# Patient Record
Sex: Female | Born: 1985 | Race: Black or African American | Hispanic: No | Marital: Single | Smoking: Never smoker
Health system: Southern US, Community
[De-identification: ages and names within clinical notes are randomized; demographics above are authoritative.]

## PROBLEM LIST (undated history)

## (undated) DIAGNOSIS — D649 Anemia, unspecified: Secondary | ICD-10-CM

## (undated) DIAGNOSIS — O468X9 Other antepartum hemorrhage, unspecified trimester: Secondary | ICD-10-CM

## (undated) DIAGNOSIS — O418X90 Other specified disorders of amniotic fluid and membranes, unspecified trimester, not applicable or unspecified: Secondary | ICD-10-CM

## (undated) DIAGNOSIS — O09299 Supervision of pregnancy with other poor reproductive or obstetric history, unspecified trimester: Secondary | ICD-10-CM

---

## 2009-01-16 ENCOUNTER — Emergency Department (HOSPITAL_COMMUNITY): Admission: EM | Admit: 2009-01-16 | Discharge: 2009-01-16 | Payer: Self-pay | Admitting: Emergency Medicine

## 2009-03-09 ENCOUNTER — Emergency Department (HOSPITAL_BASED_OUTPATIENT_CLINIC_OR_DEPARTMENT_OTHER): Admission: EM | Admit: 2009-03-09 | Discharge: 2009-03-09 | Payer: Self-pay | Admitting: Emergency Medicine

## 2009-06-22 ENCOUNTER — Emergency Department (HOSPITAL_BASED_OUTPATIENT_CLINIC_OR_DEPARTMENT_OTHER): Admission: EM | Admit: 2009-06-22 | Discharge: 2009-06-22 | Payer: Self-pay | Admitting: Emergency Medicine

## 2009-06-22 ENCOUNTER — Ambulatory Visit: Payer: Self-pay | Admitting: Diagnostic Radiology

## 2009-10-04 ENCOUNTER — Emergency Department (HOSPITAL_BASED_OUTPATIENT_CLINIC_OR_DEPARTMENT_OTHER): Admission: EM | Admit: 2009-10-04 | Discharge: 2009-10-04 | Payer: Self-pay | Admitting: Emergency Medicine

## 2010-05-25 ENCOUNTER — Emergency Department (HOSPITAL_BASED_OUTPATIENT_CLINIC_OR_DEPARTMENT_OTHER)
Admission: EM | Admit: 2010-05-25 | Discharge: 2010-05-26 | Disposition: A | Discharge status: Home / Self Care | Payer: Medicaid Other | Attending: Emergency Medicine | Admitting: Emergency Medicine

## 2010-05-25 DIAGNOSIS — R109 Unspecified abdominal pain: Secondary | ICD-10-CM | POA: Insufficient documentation

## 2010-05-25 LAB — URINALYSIS, ROUTINE W REFLEX MICROSCOPIC
Hgb urine dipstick: NEGATIVE
Ketones, ur: NEGATIVE mg/dL
Nitrite: NEGATIVE
Protein, ur: NEGATIVE mg/dL
Specific Gravity, Urine: 1.03 (ref 1.005–1.030)
Urine Glucose, Fasting: NEGATIVE mg/dL
pH: 7 (ref 5.0–8.0)

## 2010-05-25 LAB — COMPREHENSIVE METABOLIC PANEL
BUN: 12 mg/dL (ref 6–23)
Calcium: 9.8 mg/dL (ref 8.4–10.5)
Chloride: 109 mEq/L (ref 96–112)
GFR calc Af Amer: >60 mL/min (ref >60)
GFR calc non Af Amer: >60 mL/min (ref >60)

## 2010-05-25 LAB — CBC
MCHC: 33 g/dL (ref 30.0–36.0)
RBC: 4.67 MIL/uL (ref 3.87–5.11)
RDW: 15.2 % (ref 11.5–15.5)

## 2010-09-03 ENCOUNTER — Emergency Department (HOSPITAL_BASED_OUTPATIENT_CLINIC_OR_DEPARTMENT_OTHER)
Admission: EM | Admit: 2010-09-03 | Discharge: 2010-09-03 | Disposition: A | Discharge status: Home / Self Care | Payer: Self-pay | Attending: Emergency Medicine | Admitting: Emergency Medicine

## 2010-09-03 DIAGNOSIS — H109 Unspecified conjunctivitis: Secondary | ICD-10-CM | POA: Insufficient documentation

## 2010-09-03 DIAGNOSIS — J45909 Unspecified asthma, uncomplicated: Secondary | ICD-10-CM | POA: Insufficient documentation

## 2010-09-03 DIAGNOSIS — R059 Cough, unspecified: Secondary | ICD-10-CM | POA: Insufficient documentation

## 2010-09-03 DIAGNOSIS — R05 Cough: Secondary | ICD-10-CM | POA: Insufficient documentation

## 2010-09-03 DIAGNOSIS — H5789 Other specified disorders of eye and adnexa: Secondary | ICD-10-CM | POA: Insufficient documentation

## 2011-03-31 ENCOUNTER — Emergency Department (HOSPITAL_BASED_OUTPATIENT_CLINIC_OR_DEPARTMENT_OTHER)
Admission: EM | Admit: 2011-03-31 | Discharge: 2011-03-31 | Disposition: A | Discharge status: Home / Self Care | Payer: Self-pay | Attending: Emergency Medicine | Admitting: Emergency Medicine

## 2011-03-31 ENCOUNTER — Encounter: Payer: Self-pay | Admitting: Emergency Medicine

## 2011-03-31 ENCOUNTER — Emergency Department (INDEPENDENT_AMBULATORY_CARE_PROVIDER_SITE_OTHER): Payer: Self-pay

## 2011-03-31 DIAGNOSIS — J45909 Unspecified asthma, uncomplicated: Secondary | ICD-10-CM | POA: Insufficient documentation

## 2011-03-31 DIAGNOSIS — R0602 Shortness of breath: Secondary | ICD-10-CM

## 2011-03-31 DIAGNOSIS — R079 Chest pain, unspecified: Secondary | ICD-10-CM

## 2011-03-31 DIAGNOSIS — M549 Dorsalgia, unspecified: Secondary | ICD-10-CM

## 2011-03-31 DIAGNOSIS — IMO0002 Reserved for concepts with insufficient information to code with codable children: Secondary | ICD-10-CM | POA: Insufficient documentation

## 2011-03-31 DIAGNOSIS — R209 Unspecified disturbances of skin sensation: Secondary | ICD-10-CM | POA: Insufficient documentation

## 2011-03-31 DIAGNOSIS — R091 Pleurisy: Secondary | ICD-10-CM | POA: Insufficient documentation

## 2011-03-31 HISTORY — DX: Anemia, unspecified: D64.9

## 2011-03-31 MED ORDER — NAPROXEN 500 MG PO TABS: 500.0000 mg | ORAL_TABLET | Freq: Two times a day (BID) | ORAL | Status: DC

## 2011-03-31 MED ORDER — DOXYCYCLINE HYCLATE 100 MG PO CAPS: 100.0000 mg | ORAL_CAPSULE | Freq: Two times a day (BID) | ORAL | Status: AC

## 2011-03-31 NOTE — ED Notes (Signed)
Pt states she has been having chest pain for one month, intermittent stabbing pain radiates through back.  Occasional SOB.  Also co numbness/tingling in legs.  Pt states she has noticed that she falls asleep behind the wheel while driving from work.  Denies N/V or diaphoresis.  Did admit to dizziness on one occasion.

## 2011-03-31 NOTE — ED Provider Notes (Signed)
History     CSN: [id number]  Arrival date & time 03/31/11  1839   First MD Initiated Contact with Patient 03/31/11 1901      Chief Complaint  Patient presents with  . Chest Pain  . Shortness of Breath  . Numbness    `    (Consider location/radiation/quality/duration/timing/severity/associated sxs/prior treatment) HPI Comments: Patient is a healthy 26 year old female with history of anemia who presents with 3 complaints.  #1 chest pain. The patient states that she has intermittent chest pain for the last month which comes on the middle of her chest radiating to her right breast and back. It is described as a stabbing pain, lasts approximately one to 2 minutes, pain is exacerbated by deep breathing when the pain comes on. She denies cough fever swelling nausea shortness of breath headache sore throat or nasal congestion. She states that the pain only happens when she is at work, does not happen when she works out and she has been exercising regularly over the last month. She denies travel, swelling, trauma, surgery.  #2 tingling in the feet.  Patient states that over the last several months she has had tingling in her bilateral feet that comes on when she raises her feet at night on pillow. It feels like pins and needles and goes away when she puts her feet down and starts to walk. She denies any symptoms at this time  #3 right index finger swelling.  She admits to having approximately 3-4 days of swelling of the right index finger over the distal radial surface of the finger in the paronychial area.  This started 3-4 days ago, occurred after she had her nails done at the nail salon. It is mild and is spontaneously draining purulent material onto the nail. She denies fevers nausea vomiting. She is not a diabetic  Patient is a 26 y.o. female presenting with chest pain and shortness of breath. The history is provided by the patient.  Chest Pain Pertinent negatives for primary symptoms include  no shortness of breath.    Shortness of Breath  Associated symptoms include chest pain. Pertinent negatives include no shortness of breath.    Past Medical History  Diagnosis Date  . Asthma   . Anemia     Past Surgical History  Procedure Date  . Cesarean section     History reviewed. No pertinent family history.  History  Substance Use Topics  . Smoking status: Not on file  . Smokeless tobacco: Not on file  . Alcohol Use:     OB History    Grav Para Term Preterm Abortions TAB SAB Ect Mult Living                  Review of Systems  Respiratory: Negative for shortness of breath.   Cardiovascular: Positive for chest pain.  All other systems reviewed and are negative.    Allergies  Review of patient's allergies indicates no known allergies.  Home Medications   Current Outpatient Rx  Name Route Sig Dispense Refill  . ASPIRIN 500 MG PO TBEC Oral Take 100 mg by mouth once as needed. For chest pain      . DOXYCYCLINE HYCLATE 100 MG PO CAPS Oral Take 1 capsule (100 mg total) by mouth 2 (two) times daily. 20 capsule 0  . NAPROXEN 500 MG PO TABS Oral Take 1 tablet (500 mg total) by mouth 2 (two) times daily with a meal. 30 tablet 0    BP 115/76  Pulse 85  Temp(Src) 98.3 F (36.8 C) (Oral)  Resp 20  SpO2 100%  LMP 03/26/2011  Physical Exam  Nursing note and vitals reviewed. Constitutional: She appears well-developed and well-nourished. No distress.  HENT:  Head: Normocephalic and atraumatic.  Mouth/Throat: Oropharynx is clear and moist. No oropharyngeal exudate.  Eyes: Conjunctivae and EOM are normal. Pupils are equal, round, and reactive to light. Right eye exhibits no discharge. Left eye exhibits no discharge. No scleral icterus.  Neck: Normal range of motion. Neck supple. No JVD present. No thyromegaly present.  Cardiovascular: Normal rate, regular rhythm, normal heart sounds and intact distal pulses.  Exam reveals no gallop and no friction rub.   No  murmur heard. Pulmonary/Chest: Effort normal and breath sounds normal. No respiratory distress. She has no wheezes. She has no rales. She exhibits no tenderness.  Abdominal: Soft. Bowel sounds are normal. She exhibits no distension and no mass. There is no tenderness.  Musculoskeletal: Normal range of motion. She exhibits tenderness ( Mild swelling and tenderness to the right index finger over the radial surface of the nail, there is a paronychia which is very small, spontaneously draining, not associated with a felon for any proximal redness swelling or edema.). She exhibits no edema.  Lymphadenopathy:    She has no cervical adenopathy.  Neurological: She is alert. Coordination normal.       Neurologic exam:  Speech clear, pupils equal round reactive to light, extraocular movements intact  Normal peripheral visual fields Cranial nerves III through XII normal including no facial droop Follows commands, moves all extremities x4, normal strength to bilateral upper and lower extremities at all major muscle groups including grip Sensation normal to light touch and pinprick Coordination intact, no limb ataxia, finger-nose-finger normal Rapid alternating movements normal No pronator drift Gait normal   Skin: Skin is warm and dry. No rash noted. No erythema.  Psychiatric: She has a normal mood and affect. Her behavior is normal.    ED Course  Procedures (including critical care time)  Labs Reviewed - No data to display Dg Chest 2 View  03/31/2011  *RADIOLOGY REPORT*  Clinical Data: Chest pain for 1 month, occasionally extending to the back and with shortness of breath.  CHEST - 2 VIEW  Comparison: None.  Findings: Cardiac and mediastinal contours appear normal.  The lungs appear clear.  No pleural effusion is identified.  IMPRESSION:  No significant abnormality identified.  Original Report Authenticated By: Dellia Cloud, M.D.     1. Pleurisy   2. Paronychia       MDM  Normal  neurologic exam, suspect a tingling in the feet is related to position and blood flow, she has normal pulses, good capillary refill and a normal neurologic exam at this time. Chest pain is probably pleuritic, she has no risk factors for pulmonary embolism and appears very comfortable without any pain at this time. We'll obtain a chest x-ray to rule out other sources of the pain. This is not consistent with a cardiac source. Paronychia is spontaneously draining, will give doxycycline but no incision and drainage necessary at this time.  CXR neg by my exam.        Vida Roller, MD 03/31/11 2128

## 2011-05-05 ENCOUNTER — Encounter (HOSPITAL_BASED_OUTPATIENT_CLINIC_OR_DEPARTMENT_OTHER): Payer: Self-pay | Admitting: *Deleted

## 2011-05-05 ENCOUNTER — Emergency Department (HOSPITAL_BASED_OUTPATIENT_CLINIC_OR_DEPARTMENT_OTHER)
Admission: EM | Admit: 2011-05-05 | Discharge: 2011-05-05 | Disposition: A | Discharge status: Home / Self Care | Payer: Self-pay | Attending: Emergency Medicine | Admitting: Emergency Medicine

## 2011-05-05 DIAGNOSIS — A499 Bacterial infection, unspecified: Secondary | ICD-10-CM | POA: Insufficient documentation

## 2011-05-05 DIAGNOSIS — R109 Unspecified abdominal pain: Secondary | ICD-10-CM | POA: Insufficient documentation

## 2011-05-05 DIAGNOSIS — J45909 Unspecified asthma, uncomplicated: Secondary | ICD-10-CM | POA: Insufficient documentation

## 2011-05-05 DIAGNOSIS — N760 Acute vaginitis: Secondary | ICD-10-CM | POA: Insufficient documentation

## 2011-05-05 DIAGNOSIS — B9689 Other specified bacterial agents as the cause of diseases classified elsewhere: Secondary | ICD-10-CM | POA: Insufficient documentation

## 2011-05-05 LAB — URINALYSIS, ROUTINE W REFLEX MICROSCOPIC
Bilirubin Urine: NEGATIVE
Ketones, ur: NEGATIVE mg/dL
Leukocytes, UA: NEGATIVE
Nitrite: NEGATIVE
Specific Gravity, Urine: 1.006 (ref 1.005–1.030)
pH: 6 (ref 5.0–8.0)

## 2011-05-05 LAB — WET PREP, GENITAL: Trich, Wet Prep: NONE SEEN

## 2011-05-05 LAB — PREGNANCY, URINE: Preg Test, Ur: NEGATIVE

## 2011-05-05 MED ORDER — METRONIDAZOLE 500 MG PO TABS: 500.0000 mg | ORAL_TABLET | Freq: Two times a day (BID) | ORAL | Status: AC

## 2011-05-05 MED ORDER — FLUCONAZOLE 150 MG PO TABS: 150.0000 mg | ORAL_TABLET | Freq: Once | ORAL | Status: AC

## 2011-05-05 NOTE — ED Notes (Signed)
Patient states she had a sudden on set of cramps in her upper right leg 5 days ago.  Since she has had intermittent right  abdominal pain.  Describes pain as sharp.  States she had bleeding after urination 4 days ago x 1.

## 2011-05-05 NOTE — ED Provider Notes (Signed)
Medical screening examination/treatment/procedure(s) were performed by non-physician practitioner and as supervising physician I was immediately available for consultation/collaboration.   Dayton Bailiff, MD 05/05/11 [phone]

## 2011-05-05 NOTE — ED Provider Notes (Signed)
History     CSN: [id number]  Arrival date & time 05/05/11  1620   First MD Initiated Contact with Patient 05/05/11 1726      Chief Complaint  Patient presents with  . Abdominal Pain    (Consider location/radiation/quality/duration/timing/severity/associated sxs/prior treatment) Patient is a 26 y.o. female presenting with cramps. The history is provided by the patient. No language interpreter was used.  Abdominal Cramping The primary symptoms of the illness include abdominal pain. The current episode started 2 days ago. The onset of the illness was gradual. The problem has not changed since onset. The patient states that she believes she is currently not pregnant. The patient has not had a change in bowel habit. Symptoms associated with the illness do not include chills, urgency, frequency or back pain. Significant associated medical issues do not include diabetes.  Pt complains of lower abdominal cramping.  Pt reports she had vaginal spotting several days  Past Medical History  Diagnosis Date  . Asthma   . Anemia     Past Surgical History  Procedure Date  . Cesarean section     No family history on file.  History  Substance Use Topics  . Smoking status: Never Smoker   . Smokeless tobacco: Not on file  . Alcohol Use: No    OB History    Grav Para Term Preterm Abortions TAB SAB Ect Mult Living                  Review of Systems  Constitutional: Negative for chills.  Gastrointestinal: Positive for abdominal pain.  Genitourinary: Negative for urgency and frequency.  Musculoskeletal: Negative for back pain.  All other systems reviewed and are negative.    Allergies  Review of patient's allergies indicates no known allergies.  Home Medications   Current Outpatient Rx  Name Route Sig Dispense Refill  . ASPIRIN 325 MG PO TBEC Oral Take 650 mg by mouth daily as needed.      BP 132/76  Pulse 105  Temp(Src) 98.6 F (37 C) (Oral)  Resp 20  Ht 5\' 8"  (1.727 m)   Wt 181 lb (82.101 kg)  BMI 27.52 kg/m2  SpO2 100%  LMP 04/19/2011  Physical Exam  Nursing note and vitals reviewed. Constitutional: She is oriented to person, place, and time. She appears well-developed and well-nourished.  HENT:  Head: Normocephalic and atraumatic.  Eyes: Pupils are equal, round, and reactive to light.  Neck: Normal range of motion.  Cardiovascular: Normal rate, regular rhythm and normal heart sounds.   Pulmonary/Chest: Effort normal and breath sounds normal.  Abdominal: Soft. Bowel sounds are normal.  Genitourinary: Uterus normal. Vaginal discharge found.  Musculoskeletal: Normal range of motion.  Neurological: She is alert and oriented to person, place, and time. She has normal reflexes.  Skin: Skin is warm.  Psychiatric: She has a normal mood and affect.    ED Course  Procedures (including critical care time)   Labs Reviewed  URINALYSIS, ROUTINE W REFLEX MICROSCOPIC  PREGNANCY, URINE   No results found.   No diagnosis found.    MDM      Results for orders placed during the hospital encounter of 05/05/11  URINALYSIS, ROUTINE W REFLEX MICROSCOPIC      Component Value Range   Color, Urine YELLOW  YELLOW    APPearance CLEAR  CLEAR    Specific Gravity, Urine 1.006  1.005 - 1.030    pH 6.0  5.0 - 8.0    Glucose, UA NEGATIVE  NEGATIVE (mg/dL)   Hgb urine dipstick NEGATIVE  NEGATIVE    Bilirubin Urine NEGATIVE  NEGATIVE    Ketones, ur NEGATIVE  NEGATIVE (mg/dL)   Protein, ur NEGATIVE  NEGATIVE (mg/dL)   Urobilinogen, UA 0.2  0.0 - 1.0 (mg/dL)   Nitrite NEGATIVE  NEGATIVE    Leukocytes, UA NEGATIVE  NEGATIVE   PREGNANCY, URINE      Component Value Range   Preg Test, Ur NEGATIVE  NEGATIVE   WET PREP, GENITAL      Component Value Range   Yeast Wet Prep HPF POC MODERATE (*) NONE SEEN    Trich, Wet Prep NONE SEEN  NONE SEEN    Clue Cells Wet Prep HPF POC MANY (*) NONE SEEN    WBC, Wet Prep HPF POC MANY (*) NONE SEEN    No results found. rx  for flagyl and diflucan    Langston Masker, [place] 05/05/11 1905

## 2011-05-06 LAB — GC/CHLAMYDIA PROBE AMP, GENITAL
Chlamydia, DNA Probe: NEGATIVE
GC Probe Amp, Genital: NEGATIVE

## 2011-07-28 ENCOUNTER — Emergency Department (HOSPITAL_BASED_OUTPATIENT_CLINIC_OR_DEPARTMENT_OTHER)
Admission: EM | Admit: 2011-07-28 | Discharge: 2011-07-28 | Discharge status: Against Medical Advice | Payer: Self-pay | Attending: Emergency Medicine | Admitting: Emergency Medicine

## 2011-07-28 ENCOUNTER — Encounter (HOSPITAL_BASED_OUTPATIENT_CLINIC_OR_DEPARTMENT_OTHER): Payer: Self-pay

## 2011-07-28 DIAGNOSIS — J45909 Unspecified asthma, uncomplicated: Secondary | ICD-10-CM | POA: Insufficient documentation

## 2011-07-28 DIAGNOSIS — R109 Unspecified abdominal pain: Secondary | ICD-10-CM | POA: Insufficient documentation

## 2011-07-28 LAB — URINALYSIS, ROUTINE W REFLEX MICROSCOPIC
Bilirubin Urine: NEGATIVE
Specific Gravity, Urine: 1.025 (ref 1.005–1.030)

## 2011-07-28 LAB — URINE MICROSCOPIC-ADD ON

## 2011-07-28 NOTE — ED Notes (Signed)
Went to patients room and gown found on bed. Unable to locate pt.

## 2011-07-28 NOTE — ED Notes (Signed)
Pt reports abdominal discomfort intermittently x 1 month.  Irregular menstrual cycle and is 4 days late.

## 2011-10-03 ENCOUNTER — Encounter (HOSPITAL_BASED_OUTPATIENT_CLINIC_OR_DEPARTMENT_OTHER): Payer: Self-pay | Admitting: *Deleted

## 2011-10-03 ENCOUNTER — Emergency Department (HOSPITAL_BASED_OUTPATIENT_CLINIC_OR_DEPARTMENT_OTHER)
Admission: EM | Admit: 2011-10-03 | Discharge: 2011-10-03 | Disposition: A | Discharge status: Home / Self Care | Payer: Self-pay | Attending: Emergency Medicine | Admitting: Emergency Medicine

## 2011-10-03 DIAGNOSIS — S058X9A Other injuries of unspecified eye and orbit, initial encounter: Secondary | ICD-10-CM | POA: Insufficient documentation

## 2011-10-03 DIAGNOSIS — D649 Anemia, unspecified: Secondary | ICD-10-CM | POA: Insufficient documentation

## 2011-10-03 DIAGNOSIS — H109 Unspecified conjunctivitis: Secondary | ICD-10-CM

## 2011-10-03 DIAGNOSIS — S0500XA Injury of conjunctiva and corneal abrasion without foreign body, unspecified eye, initial encounter: Secondary | ICD-10-CM

## 2011-10-03 DIAGNOSIS — X58XXXA Exposure to other specified factors, initial encounter: Secondary | ICD-10-CM | POA: Insufficient documentation

## 2011-10-03 MED ORDER — TOBRAMYCIN-DEXAMETHASONE 0.3-0.1 % OP SUSP
1.0000 [drp] | OPHTHALMIC | Status: DC
  Administered 2011-10-03: 1 [drp] via OPHTHALMIC

## 2011-10-03 MED ORDER — FLUORESCEIN SODIUM 1 MG OP STRP
1.0000 | ORAL_STRIP | Freq: Once | OPHTHALMIC | Status: AC
  Administered 2011-10-03: 1 via OPHTHALMIC

## 2011-10-03 MED ORDER — TETRACAINE HCL 0.5 % OP SOLN
1.0000 [drp] | Freq: Once | OPHTHALMIC | Status: AC
  Administered 2011-10-03: 1 [drp] via OPHTHALMIC

## 2011-10-03 MED ORDER — TOBRAMYCIN-DEXAMETHASONE 0.3-0.1 % OP SUSP
OPHTHALMIC | Status: AC
  Filled 2011-10-03: qty 2.5

## 2011-10-03 MED ORDER — TETRACAINE HCL 0.5 % OP SOLN
OPHTHALMIC | Status: AC
  Administered 2011-10-03: 1 [drp] via OPHTHALMIC
  Filled 2011-10-03: qty 2

## 2011-10-03 MED ORDER — FLUORESCEIN SODIUM 1 MG OP STRP
ORAL_STRIP | OPHTHALMIC | Status: AC
  Administered 2011-10-03: 1 via OPHTHALMIC
  Filled 2011-10-03: qty 1

## 2011-10-03 NOTE — ED Notes (Signed)
Pt state her left eye has been red since yesterday. +drainage. No itching. Hx ulcer

## 2011-10-03 NOTE — ED Provider Notes (Signed)
History   This chart was scribed for Gwyneth Sprout, MD by Sofie Rower. The patient was seen in room MH01/MH01 and the patient's care was started at 2:59 PM     CSN: [id number]  Arrival date & time 10/03/11  1315   First MD Initiated Contact with Patient 10/03/11 1456      Chief Complaint  Patient presents with  . Eye Problem    (Consider location/radiation/quality/duration/timing/severity/associated sxs/prior treatment) Patient is a 26 y.o. female presenting with eye problem. The history is provided by the patient. No language interpreter was used.  Eye Problem  This is a new problem. The current episode started yesterday. The problem occurs constantly. The problem has not changed since onset.There is pain in the left eye. The injury mechanism is unknown. The pain is mild. There is no history of trauma to the eye. She wears contacts. Associated symptoms include discharge. Pertinent negatives include no blurred vision, no vomiting and no itching. She has tried nothing for the symptoms. The treatment provided no relief.     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    Past Medical History  Diagnosis Date  . Asthma   . Anemia     Past Surgical History  Procedure Date  . Cesarean section     History reviewed. No pertinent family history.  History  Substance Use Topics  . Smoking status: Never Smoker   . Smokeless tobacco: Not on file  . Alcohol Use: No    OB History    Grav Para Term Preterm Abortions TAB SAB Ect Mult Living                  Review of Systems  Eyes: Positive for discharge. Negative for blurred vision.  Gastrointestinal: Negative for vomiting.  Skin: Negative for itching.  All other systems reviewed and are negative.   10 Systems reviewed and all are negative for acute change except as noted in the HPI.   Allergies  Review of patient's allergies indicates no known allergies.  Home Medications   Current Outpatient Rx  Name Route Sig Dispense  Refill  . ASPIRIN 325 MG PO TBEC Oral Take 650 mg by mouth daily as needed.      BP 115/68  Pulse 80  Temp 97.9 F (36.6 C) (Oral)  Resp 20  Ht 5\' 8"  (1.727 m)  Wt 170 lb (77.111 kg)  BMI 25.85 kg/m2  SpO2 98%  LMP 09/03/2011  Physical Exam  Nursing note and vitals reviewed. Constitutional: She appears well-developed and well-nourished.  HENT:  Head: Atraumatic.  Nose: Nose normal.       No periauricle lymphadenopathy.   Eyes:         Injection of the left conjunctiva.   Neck: Normal range of motion.  Cardiovascular: Normal rate, regular rhythm and normal heart sounds.   No murmur heard. Pulmonary/Chest: Effort normal and breath sounds normal. She has no wheezes.  Neurological: She is alert.  Skin: Skin is warm and dry.  Psychiatric: She has a normal mood and affect. Her behavior is normal.    ED Course  Procedures (including critical care time)  DIAGNOSTIC STUDIES: Oxygen Saturation is 98% on room air, normal by my interpretation.    COORDINATION OF CARE:     3:00PM- EDP at bedside discusses treatment plan concerning eye drops.   Labs Reviewed - No data to display No results found.   1. Conjunctivitis   2. Corneal abrasion  MDM   Pt with signs of conjunctivitis that started yesterday with small corneal abrasion that worsened over the last 24 hours.  Contact wearer but has not used contacts since sx started.  Pt to not use contacts and tobradex started.  Will f/u with ophtho on Monday or tues.      I personally performed the services described in this documentation, which was scribed in my presence.  The recorded information has been reviewed and considered.     Gwyneth Sprout, MD 10/03/11 1544

## 2012-01-30 ENCOUNTER — Emergency Department (HOSPITAL_BASED_OUTPATIENT_CLINIC_OR_DEPARTMENT_OTHER)
Admission: EM | Admit: 2012-01-30 | Discharge: 2012-01-30 | Disposition: A | Discharge status: Home / Self Care | Payer: Self-pay | Attending: Emergency Medicine | Admitting: Emergency Medicine

## 2012-01-30 ENCOUNTER — Encounter (HOSPITAL_BASED_OUTPATIENT_CLINIC_OR_DEPARTMENT_OTHER): Payer: Self-pay | Admitting: *Deleted

## 2012-01-30 DIAGNOSIS — N6452 Nipple discharge: Secondary | ICD-10-CM

## 2012-01-30 DIAGNOSIS — J45909 Unspecified asthma, uncomplicated: Secondary | ICD-10-CM | POA: Insufficient documentation

## 2012-01-30 DIAGNOSIS — R51 Headache: Secondary | ICD-10-CM

## 2012-01-30 DIAGNOSIS — N6459 Other signs and symptoms in breast: Secondary | ICD-10-CM | POA: Insufficient documentation

## 2012-01-30 DIAGNOSIS — D649 Anemia, unspecified: Secondary | ICD-10-CM | POA: Insufficient documentation

## 2012-01-30 LAB — CBC WITH DIFFERENTIAL/PLATELET
Basophils Relative: 0 % (ref 0–1)
HCT: 35.2 % — ABNORMAL LOW (ref 36.0–46.0)
Hemoglobin: 11.6 g/dL — ABNORMAL LOW (ref 12.0–15.0)
Lymphocytes Relative: 31 % (ref 12–46)
Lymphs Abs: 2.9 10*3/uL (ref 0.7–4.0)
MCH: 25 pg — ABNORMAL LOW (ref 26.0–34.0)
MCHC: 33 g/dL (ref 30.0–36.0)
MCV: 75.9 fL — ABNORMAL LOW (ref 78.0–100.0)
Monocytes Absolute: 0.7 10*3/uL (ref 0.1–1.0)
Monocytes Relative: 7 % (ref 3–12)
RDW: 14.8 % (ref 11.5–15.5)
WBC: 9.2 10*3/uL (ref 4.0–10.5)

## 2012-01-30 LAB — GLUCOSE, CAPILLARY: Glucose-Capillary: 59 mg/dL — ABNORMAL LOW (ref 70–99)

## 2012-01-30 LAB — URINALYSIS, ROUTINE W REFLEX MICROSCOPIC
Glucose, UA: NEGATIVE mg/dL
Hgb urine dipstick: NEGATIVE
Leukocytes, UA: NEGATIVE
Urobilinogen, UA: 0.2 mg/dL (ref 0.0–1.0)
pH: 6 (ref 5.0–8.0)

## 2012-01-30 LAB — PREGNANCY, URINE: Preg Test, Ur: NEGATIVE

## 2012-01-30 MED ORDER — ISOMETHEPTENE-APAP-DICHLORAL 65-325-100 MG PO CAPS: 1.0000 | ORAL_CAPSULE | Freq: Four times a day (QID) | ORAL | Status: DC | PRN

## 2012-01-30 NOTE — ED Provider Notes (Signed)
History     CSN: [id number]  Arrival date & time 01/30/12  1651   None     Chief Complaint  Patient presents with  . Headache    (Consider location/radiation/quality/duration/timing/severity/associated sxs/prior treatment) Patient is a 26 y.o. female presenting with headaches. The history is provided by the patient. No language interpreter was used.  Headache  This is a new problem. The current episode started more than 1 week ago. The problem has been resolved. The quality of the pain is described as sharp. The pain is at a severity of 6/10. The pain is moderate. The pain does not radiate. She has tried acetaminophen for the symptoms.  Pt also reports she has had some drainage from right breast.  Pt reports if she squeezes nipple she has drainage.   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  Pt was followed by the neurologist beside of [facility].   Pt reports she was suppose to follow up but has not.   Pt reports she is concerned that headache and breast drainage may be caused by growing tumor.  Pt is also worried that she could be diabetic because her Father is diabetic  Past Medical History  Diagnosis Date  . Asthma   . Anemia     Past Surgical History  Procedure Date  . Cesarean section     History reviewed. No pertinent family history.  History  Substance Use Topics  . Smoking status: Never Smoker   . Smokeless tobacco: Not on file  . Alcohol Use: No    OB History    Grav Para Term Preterm Abortions TAB SAB Ect Mult Living                  Review of Systems  Neurological: Positive for headaches.  All other systems reviewed and are negative.    Allergies  Review of patient's allergies indicates no known allergies.  Home Medications   Current Outpatient Rx  Name Route Sig Dispense Refill  . EYE DROPS OP Ophthalmic Apply 3 drops to eye daily as needed. Patient is using this eyedrop for her irritated left eye.      BP 125/76   Pulse 87  Temp 97.9 F (36.6 C)  Resp 20  Ht 5\' 7"  (1.702 m)  Wt 180 lb (81.647 kg)  BMI 28.19 kg/m2  SpO2 100%  Physical Exam  Nursing note and vitals reviewed. Constitutional: She is oriented to person, place, and time. She appears well-developed and well-nourished.  HENT:  Head: Normocephalic and atraumatic.  Right Ear: External ear normal.  Left Ear: External ear normal.  Nose: Nose normal.  Eyes: Conjunctivae normal and EOM are normal. Pupils are equal, round, and reactive to light.  Neck: Normal range of motion.  Cardiovascular: Normal rate and normal heart sounds.   Pulmonary/Chest: Effort normal and breath sounds normal.  Abdominal: Soft.  Musculoskeletal: Normal range of motion.  Neurological: She is alert and oriented to person, place, and time. She has normal reflexes.  Skin: Skin is warm.  Psychiatric: She has a normal mood and affect.    ED Course  Procedures (including critical care time)  Labs Reviewed  URINALYSIS, ROUTINE W REFLEX MICROSCOPIC - Abnormal; Notable for the following:    APPearance CLOUDY (*)     Specific Gravity, Urine 1.031 (*)     All other components within normal limits  PREGNANCY, URINE   No results found.   1. Breast discharge  2. Headache       MDM  Pt advised to follow up with neurology.   I reviewed pt's mri report.   I advised pt symptoms could be second to pituitary tumor.  Pt understands need for follow up.   cbg  Low at 59.   Pt given information for primary care MD's        Lonia Skinner [place], [place] 01/30/12 2342

## 2012-01-30 NOTE — ED Notes (Signed)
Multiple attempts to get blood samples, pt will not allow sticks to the hands.  EDP made aware.

## 2012-01-30 NOTE — ED Notes (Signed)
Pt presents to ED today with HA and right breast leakage for the last month.  Pt has family hx of MS and DM and is very concerned about MS and DM

## 2012-01-31 NOTE — ED Provider Notes (Signed)
Medical screening examination/treatment/procedure(s) were performed by non-physician practitioner and as supervising physician I was immediately available for consultation/collaboration.  Jones Skene, M.D.     Jones Skene, MD 01/31/12 1008

## 2014-03-09 ENCOUNTER — Emergency Department (HOSPITAL_BASED_OUTPATIENT_CLINIC_OR_DEPARTMENT_OTHER)
Admission: EM | Admit: 2014-03-09 | Discharge: 2014-03-09 | Disposition: A | Discharge status: Home / Self Care | Payer: Medicaid Other | Attending: Emergency Medicine | Admitting: Emergency Medicine

## 2014-03-09 ENCOUNTER — Encounter (HOSPITAL_BASED_OUTPATIENT_CLINIC_OR_DEPARTMENT_OTHER): Payer: Self-pay | Admitting: *Deleted

## 2014-03-09 DIAGNOSIS — R109 Unspecified abdominal pain: Secondary | ICD-10-CM

## 2014-03-09 DIAGNOSIS — H53149 Visual discomfort, unspecified: Secondary | ICD-10-CM | POA: Insufficient documentation

## 2014-03-09 DIAGNOSIS — Z862 Personal history of diseases of the blood and blood-forming organs and certain disorders involving the immune mechanism: Secondary | ICD-10-CM | POA: Insufficient documentation

## 2014-03-09 DIAGNOSIS — Z3202 Encounter for pregnancy test, result negative: Secondary | ICD-10-CM | POA: Insufficient documentation

## 2014-03-09 DIAGNOSIS — R51 Headache: Secondary | ICD-10-CM | POA: Insufficient documentation

## 2014-03-09 DIAGNOSIS — J45909 Unspecified asthma, uncomplicated: Secondary | ICD-10-CM | POA: Insufficient documentation

## 2014-03-09 LAB — URINALYSIS, ROUTINE W REFLEX MICROSCOPIC
BILIRUBIN URINE: NEGATIVE
Glucose, UA: NEGATIVE mg/dL
HGB URINE DIPSTICK: NEGATIVE
Ketones, ur: NEGATIVE mg/dL
Leukocytes, UA: NEGATIVE
Nitrite: NEGATIVE
Protein, ur: NEGATIVE mg/dL
SPECIFIC GRAVITY, URINE: 1.018 (ref 1.005–1.030)
UROBILINOGEN UA: 0.2 mg/dL (ref 0.0–1.0)
pH: 6 (ref 5.0–8.0)

## 2014-03-09 LAB — PREGNANCY, URINE: Preg Test, Ur: NEGATIVE

## 2014-03-09 LAB — WET PREP, GENITAL
Trich, Wet Prep: NONE SEEN
YEAST WET PREP: NONE SEEN

## 2014-03-09 LAB — HIV ANTIBODY (ROUTINE TESTING W REFLEX): HIV: NONREACTIVE

## 2014-03-09 LAB — RPR

## 2014-03-09 NOTE — ED Notes (Signed)
Pt c/o lower abdominal cramping x2 weeks. She is also c/o intermittent HA 2 weeks ago also. Pt denies discharge or dysuria.

## 2014-03-09 NOTE — ED Provider Notes (Addendum)
CSN: [id number]     Arrival date & time 03/09/14  3419 History   First MD Initiated Contact with Patient 03/09/14 [phone]     Chief Complaint  Patient presents with  . Abdominal Pain     (Consider location/radiation/quality/duration/timing/severity/associated sxs/prior Treatment) HPI Presents with intermittent abdominal cramping for the past 8 or 9 days. Pain waxes and wanes no other associated symptoms. No vaginal discharge no dysuria though admits to urinary frequency. Last bowel movement this morning, normal. No appetite change. Treated with ibuprofen with partial relief area she also reports left sided headaches, accompanied by photophobia for the past several weeks which only occur at work. No nausea. 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 Last normal menstrual period approximate 5 a half weeks ago. No other associated symptoms. No headache presently. Her abdominal pain is minimal at present. Nothing makes symptoms better or worse. Past Medical History  Diagnosis Date  . Asthma   . Anemia    Past Surgical History  Procedure Laterality Date  . Cesarean section     No family history on file. History  Substance Use Topics  . Smoking status: Never Smoker   . Smokeless tobacco: Not on file  . Alcohol Use: No   OB History    No data available     Review of Systems  Constitutional: Negative.   Respiratory: Negative.   Cardiovascular: Negative.   Gastrointestinal: Positive for abdominal pain.  Genitourinary: Positive for frequency.  Musculoskeletal: Negative.   Skin: Negative.   Neurological: Positive for headaches.  Psychiatric/Behavioral: Negative.   All other systems reviewed and are negative.     Allergies  Review of patient's allergies indicates no known allergies.  Home Medications   Prior to Admission medications   Medication Sig Start Date End Date Taking? Authorizing Provider   isometheptene-acetaminophen-dichloralphenazone (MIDRIN) 65-325-100 MG capsule Take 1 capsule by mouth 4 (four) times daily as needed. 01/30/12   Fransico Meadow, PA-C  Tetrahydrozoline HCl (EYE DROPS OP) Apply 3 drops to eye daily as needed. Patient is using this eyedrop for her irritated left eye.    Historical Provider, MD   BP 134/72 mmHg  Pulse 97  Temp(Src) 98.9 F (37.2 C) (Oral)  Resp 14  Ht 5\' 7"  (1.702 m)  Wt 180 lb (81.647 kg)  BMI 28.19 kg/m2  SpO2 98%  LMP 02/05/2014 Physical Exam  Constitutional: She appears well-developed and well-nourished.  HENT:  Head: Normocephalic and atraumatic.  Eyes: Conjunctivae are normal. Pupils are equal, round, and reactive to light.  Neck: Neck supple. No tracheal deviation present. No thyromegaly present.  Cardiovascular: Normal rate and regular rhythm.   No murmur heard. Pulmonary/Chest: Effort normal and breath sounds normal.  Abdominal: Soft. Bowel sounds are normal. She exhibits no distension. There is no tenderness.  Genitourinary:  No external lesion. Cervical os closed. Minimal whitish vaginal discharge. No cervical motion tenderness no adnexal masses or tenderness  Musculoskeletal: Normal range of motion. She exhibits no edema or tenderness.  Neurological: She is alert. Coordination normal.  Skin: Skin is warm and dry. No rash noted.  Psychiatric: She has a normal mood and affect.  Nursing note and vitals reviewed.   ED Course  Procedures (including critical care time) Labs Review Labs Reviewed  GC/CHLAMYDIA PROBE AMP  WET PREP, GENITAL  URINALYSIS, ROUTINE W REFLEX MICROSCOPIC  PREGNANCY, URINE  RPR  HIV ANTIBODY (ROUTINE TESTING)    Imaging Review No results found.  EKG Interpretation None     11:55 AM patient states she feels ready to go home. She is resting comfortably. Results for orders placed or performed during the hospital encounter of 03/09/14  Wet prep, genital  Result Value Ref Range   Yeast Wet  Prep HPF POC NONE SEEN NONE SEEN   Trich, Wet Prep NONE SEEN NONE SEEN   Clue Cells Wet Prep HPF POC FEW (A) NONE SEEN   WBC, Wet Prep HPF POC FEW (A) NONE SEEN  Urinalysis, Routine w reflex microscopic  Result Value Ref Range   Color, Urine YELLOW YELLOW   APPearance CLEAR CLEAR   Specific Gravity, Urine 1.018 1.005 - 1.030   pH 6.0 5.0 - 8.0   Glucose, UA NEGATIVE NEGATIVE mg/dL   Hgb urine dipstick NEGATIVE NEGATIVE   Bilirubin Urine NEGATIVE NEGATIVE   Ketones, ur NEGATIVE NEGATIVE mg/dL   Protein, ur NEGATIVE NEGATIVE mg/dL   Urobilinogen, UA 0.2 0.0 - 1.0 mg/dL   Nitrite NEGATIVE NEGATIVE   Leukocytes, UA NEGATIVE NEGATIVE  Pregnancy, urine  Result Value Ref Range   Preg Test, Ur NEGATIVE NEGATIVE   No results found.  MDM  Patient has irregular menses. She is presently one week late for her menstrual period. Plan ibuprofen for pain which she says controls her pain. Cervical cultures, RPR, HIV test pending She is instructed to follow-up with her gynecologist Diagnosis nonspecific abdominal pain Final diagnoses:  None        Orlie Dakin, MD 03/09/14 1200  Orlie Dakin, MD 03/09/14 1201

## 2014-03-09 NOTE — Discharge Instructions (Signed)
Abdominal Pain, Women Continue ibuprofen for discomfort as directed. You can take 4 Advil 3 times daily (which is ibuprofen 800 mg) for abdominal discomfort. Contact your gynecologist to schedule an office visit regarding your irregular menstrual periods. Abdominal (stomach, pelvic, or belly) pain can be caused by many things. It is important to tell your doctor:  The location of the pain.  Does it come and go or is it present all the time?  Are there things that start the pain (eating certain foods, exercise)?  Are there other symptoms associated with the pain (fever, nausea, vomiting, diarrhea)? All of this is helpful to know when trying to find the cause of the pain. CAUSES   Stomach: virus or bacteria infection, or ulcer.  Intestine: appendicitis (inflamed appendix), regional ileitis (Crohn's disease), ulcerative colitis (inflamed colon), irritable bowel syndrome, diverticulitis (inflamed diverticulum of the colon), or cancer of the stomach or intestine.  Gallbladder disease or stones in the gallbladder.  Kidney disease, kidney stones, or infection.  Pancreas infection or cancer.  Fibromyalgia (pain disorder).  Diseases of the female organs:  Uterus: fibroid (non-cancerous) tumors or infection.  Fallopian tubes: infection or tubal pregnancy.  Ovary: cysts or tumors.  Pelvic adhesions (scar tissue).  Endometriosis (uterus lining tissue growing in the pelvis and on the pelvic organs).  Pelvic congestion syndrome (female organs filling up with blood just before the menstrual period).  Pain with the menstrual period.  Pain with ovulation (producing an egg).  Pain with an IUD (intrauterine device, birth control) in the uterus.  Cancer of the female organs.  Functional pain (pain not caused by a disease, may improve without treatment).  Psychological pain.  Depression. DIAGNOSIS  Your doctor will decide the seriousness of your pain by doing an examination.  Blood  tests.  X-rays.  Ultrasound.  CT scan (computed tomography, special type of X-ray).  MRI (magnetic resonance imaging).  Cultures, for infection.  Barium enema (dye inserted in the large intestine, to better view it with X-rays).  Colonoscopy (looking in intestine with a lighted tube).  Laparoscopy (minor surgery, looking in abdomen with a lighted tube).  Major abdominal exploratory surgery (looking in abdomen with a large incision). TREATMENT  The treatment will depend on the cause of the pain.   Many cases can be observed and treated at home.  Over-the-counter medicines recommended by your caregiver.  Prescription medicine.  Antibiotics, for infection.  Birth control pills, for painful periods or for ovulation pain.  Hormone treatment, for endometriosis.  Nerve blocking injections.  Physical therapy.  Antidepressants.  Counseling with a psychologist or psychiatrist.  Minor or major surgery. HOME CARE INSTRUCTIONS   Do not take laxatives, unless directed by your caregiver.  Take over-the-counter pain medicine only if ordered by your caregiver. Do not take aspirin because it can cause an upset stomach or bleeding.  Try a clear liquid diet (broth or water) as ordered by your caregiver. Slowly move to a bland diet, as tolerated, if the pain is related to the stomach or intestine.  Have a thermometer and take your temperature several times a day, and record it.  Bed rest and sleep, if it helps the pain.  Avoid sexual intercourse, if it causes pain.  Avoid stressful situations.  Keep your follow-up appointments and tests, as your caregiver orders.  If the pain does not go away with medicine or surgery, you may try:  Acupuncture.  Relaxation exercises (yoga, meditation).  Group therapy.  Counseling. SEEK MEDICAL CARE IF:  You notice certain foods cause stomach pain.  Your home care treatment is not helping your pain.  You need stronger pain  medicine.  You want your IUD removed.  You feel faint or lightheaded.  You develop nausea and vomiting.  You develop a rash.  You are having side effects or an allergy to your medicine. SEEK IMMEDIATE MEDICAL CARE IF:   Your pain does not go away or gets worse.  You have a fever.  Your pain is felt only in portions of the abdomen. The right side could possibly be appendicitis. The left lower portion of the abdomen could be colitis or diverticulitis.  You are passing blood in your stools (bright red or black tarry stools, with or without vomiting).  You have blood in your urine.  You develop chills, with or without a fever.  You pass out. MAKE SURE YOU:   Understand these instructions.  Will watch your condition.  Will get help right away if you are not doing well or get worse. Document Released: 01/11/2007 Document Revised: 07/31/2013 Document Reviewed: 01/31/2009 [facility] Patient Information 2015 [place], [place]. This information is not intended to replace advice given to you by your health care provider. Make sure you discuss any questions you have with your health care provider.

## 2014-03-10 LAB — GC/CHLAMYDIA PROBE AMP
CT PROBE, AMP APTIMA: NEGATIVE
GC Probe RNA: NEGATIVE

## 2014-03-30 DIAGNOSIS — O418X90 Other specified disorders of amniotic fluid and membranes, unspecified trimester, not applicable or unspecified: Secondary | ICD-10-CM

## 2014-03-30 HISTORY — DX: Other specified disorders of amniotic fluid and membranes, unspecified trimester, not applicable or unspecified: O41.8X90

## 2015-01-09 ENCOUNTER — Emergency Department (HOSPITAL_BASED_OUTPATIENT_CLINIC_OR_DEPARTMENT_OTHER): Payer: Medicaid Other

## 2015-01-09 ENCOUNTER — Emergency Department (HOSPITAL_BASED_OUTPATIENT_CLINIC_OR_DEPARTMENT_OTHER)
Admission: EM | Admit: 2015-01-09 | Discharge: 2015-01-09 | Disposition: A | Discharge status: Home / Self Care | Payer: Medicaid Other | Attending: Emergency Medicine | Admitting: Emergency Medicine

## 2015-01-09 ENCOUNTER — Encounter (HOSPITAL_BASED_OUTPATIENT_CLINIC_OR_DEPARTMENT_OTHER): Payer: Self-pay

## 2015-01-09 DIAGNOSIS — Z862 Personal history of diseases of the blood and blood-forming organs and certain disorders involving the immune mechanism: Secondary | ICD-10-CM | POA: Insufficient documentation

## 2015-01-09 DIAGNOSIS — R51 Headache: Secondary | ICD-10-CM | POA: Insufficient documentation

## 2015-01-09 DIAGNOSIS — M940 Chondrocostal junction syndrome [Tietze]: Secondary | ICD-10-CM

## 2015-01-09 DIAGNOSIS — Z3202 Encounter for pregnancy test, result negative: Secondary | ICD-10-CM | POA: Insufficient documentation

## 2015-01-09 DIAGNOSIS — J45901 Unspecified asthma with (acute) exacerbation: Secondary | ICD-10-CM | POA: Insufficient documentation

## 2015-01-09 LAB — CBC WITH DIFFERENTIAL/PLATELET
BASOS ABS: 0 10*3/uL (ref 0.0–0.1)
BASOS PCT: 0 %
Eosinophils Absolute: 0.1 10*3/uL (ref 0.0–0.7)
Eosinophils Relative: 1 %
HEMATOCRIT: 34 % — AB (ref 36.0–46.0)
Hemoglobin: 11 g/dL — ABNORMAL LOW (ref 12.0–15.0)
Lymphocytes Relative: 34 %
Lymphs Abs: 2.7 10*3/uL (ref 0.7–4.0)
MCH: 25 pg — ABNORMAL LOW (ref 26.0–34.0)
MCHC: 32.4 g/dL (ref 30.0–36.0)
MCV: 77.3 fL — ABNORMAL LOW (ref 78.0–100.0)
MONO ABS: 0.5 10*3/uL (ref 0.1–1.0)
Monocytes Relative: 7 %
NEUTROS ABS: 4.7 10*3/uL (ref 1.7–7.7)
NEUTROS PCT: 58 %
PLATELETS: 279 10*3/uL (ref 150–400)
RBC: 4.4 MIL/uL (ref 3.87–5.11)
RDW: 16.3 % — AB (ref 11.5–15.5)
WBC: 8.1 10*3/uL (ref 4.0–10.5)

## 2015-01-09 LAB — BASIC METABOLIC PANEL
ANION GAP: 6 (ref 5–15)
BUN: 14 mg/dL (ref 6–20)
CHLORIDE: 106 mmol/L (ref 101–111)
CO2: 27 mmol/L (ref 22–32)
Calcium: 9.6 mg/dL (ref 8.9–10.3)
Creatinine, Ser: 0.67 mg/dL (ref 0.44–1.00)
Glucose, Bld: 78 mg/dL (ref 65–99)
POTASSIUM: 4.1 mmol/L (ref 3.5–5.1)
SODIUM: 139 mmol/L (ref 135–145)

## 2015-01-09 LAB — URINALYSIS, ROUTINE W REFLEX MICROSCOPIC
Bilirubin Urine: NEGATIVE
GLUCOSE, UA: NEGATIVE mg/dL
Hgb urine dipstick: NEGATIVE
Ketones, ur: NEGATIVE mg/dL
Nitrite: NEGATIVE
PH: 6.5 (ref 5.0–8.0)
PROTEIN: NEGATIVE mg/dL
SPECIFIC GRAVITY, URINE: 1.028 (ref 1.005–1.030)
Urobilinogen, UA: 1 mg/dL (ref 0.0–1.0)

## 2015-01-09 LAB — PREGNANCY, URINE: Preg Test, Ur: NEGATIVE

## 2015-01-09 LAB — TROPONIN I

## 2015-01-09 LAB — URINE MICROSCOPIC-ADD ON

## 2015-01-09 MED ORDER — NAPROXEN 500 MG PO TABS: 500.0000 mg | ORAL_TABLET | Freq: Two times a day (BID) | ORAL | Status: DC

## 2015-01-09 NOTE — ED Notes (Signed)
C/o intermittent CP since last Friday-worse x 2 days

## 2015-01-09 NOTE — ED Provider Notes (Signed)
CSN: [id number]     Arrival date & time 01/09/15  1601 History   First MD Initiated Contact with Patient 01/09/15 1800     Chief Complaint  Patient presents with  . Chest Pain     (Consider location/radiation/quality/duration/timing/severity/associated sxs/prior Treatment) HPI Comments: Patient is a 29 year old female who presents the ED with complaint of chest pain, onset 6 days. Patient reports having intermittent sharp left-sided chest pain that is worse with deep breathing or movement. Patient states the pain radiates into her left shoulder. She notes she has been taking ibuprofen at home with mild relief but reports the pain worsened over the past 2 days. Endorses intermittent headache and SOB. Denies fever, chills, headache, diaphoresis, sore throat, cough, palpitations, abdominal pain, nausea, vomiting, diarrhea, numbness, tingling, weakness. Patient denies any recent fall, trauma, injury. She reports having similar episode of chest pain approximately one year ago and she was diagnosed with pleurisy associated with the flu. Denies any prior cardiac history. Patient is not on any birth control.   Past Medical History  Diagnosis Date  . Asthma   . Anemia    Past Surgical History  Procedure Laterality Date  . Cesarean section     No family history on file. Social History  Substance Use Topics  . Smoking status: Never Smoker   . Smokeless tobacco: None  . Alcohol Use: No   OB History    No data available     Review of Systems  Respiratory: Positive for shortness of breath.   Cardiovascular: Positive for chest pain.  Neurological: Positive for headaches.  All other systems reviewed and are negative.     Allergies  Review of patient's allergies indicates no known allergies.  Home Medications   Prior to Admission medications   Not on File   BP 122/71 mmHg  Pulse 88  Temp(Src) 98.3 F (36.8 C) (Oral)  Resp 18  Ht 5\' 7"  (1.702 m)  Wt 175 lb (79.379 kg)  BMI  27.40 kg/m2  SpO2 100%  LMP 12/03/2014 Physical Exam  Constitutional: She is oriented to person, place, and time. She appears well-developed and well-nourished. No distress.  HENT:  Head: Normocephalic and atraumatic.  Mouth/Throat: Oropharynx is clear and moist. No oropharyngeal exudate.  Eyes: Conjunctivae and EOM are normal. Pupils are equal, round, and reactive to light. Right eye exhibits no discharge. Left eye exhibits no discharge. No scleral icterus.  Neck: Normal range of motion. Neck supple.  Cardiovascular: Normal rate, regular rhythm, normal heart sounds and intact distal pulses.   No murmur heard. Pulmonary/Chest: Effort normal and breath sounds normal. No respiratory distress. She has no wheezes. She has no rales. She exhibits tenderness (Left chest wall tender to palpation).  Abdominal: Soft. Bowel sounds are normal. She exhibits no distension and no mass. There is no tenderness. There is no rebound and no guarding.  Musculoskeletal: Normal range of motion. She exhibits no edema or tenderness.  Lymphadenopathy:    She has no cervical adenopathy.  Neurological: She is alert and oriented to person, place, and time. She has normal strength. No sensory deficit.  Skin: Skin is warm and dry.  Nursing note and vitals reviewed.   ED Course  Procedures (including critical care time) Labs Review Labs Reviewed  URINALYSIS, ROUTINE W REFLEX MICROSCOPIC (NOT AT [facility]) - Abnormal; Notable for the following:    APPearance CLOUDY (*)    Leukocytes, UA MODERATE (*)    All other components within normal limits  URINE MICROSCOPIC-ADD ON -  Abnormal; Notable for the following:    Squamous Epithelial / LPF FEW (*)    All other components within normal limits  CBC WITH DIFFERENTIAL/PLATELET - Abnormal; Notable for the following:    Hemoglobin 11.0 (*)    HCT 34.0 (*)    MCV 77.3 (*)    MCH 25.0 (*)    RDW 16.3 (*)    All other components within normal limits  PREGNANCY, URINE  BASIC  METABOLIC PANEL  TROPONIN I    Imaging Review Dg Chest 2 View  01/09/2015  CLINICAL DATA:  Intermittent left-sided chest pain since last Friday, worsening the last 2 days. EXAM: CHEST  2 VIEW COMPARISON:  03/31/2011 FINDINGS: The heart size and mediastinal contours are within normal limits. Both lungs are clear. The visualized skeletal structures are unremarkable. IMPRESSION: No active cardiopulmonary disease. Electronically Signed   By: Rolm Baptise M.D.   On: 01/09/2015 18:10   I have personally reviewed and evaluated these images and lab results as part of my medical decision-making.   EKG Interpretation   Date/Time:  Wednesday January 09 2015 16:16:50 EDT Ventricular Rate:  79 PR Interval:  146 QRS Duration: 74 QT Interval:  354 QTC Calculation: 405 R Axis:   27 Text Interpretation:  Normal sinus rhythm with sinus arrhythmia  Nonspecific T wave abnormality Abnormal ECG No previous ECGs available  Confirmed by YAO  MD, DAVID ([id number]) on 01/09/2015 5:52:44 PM     Filed Vitals:   01/09/15 1611  BP: 122/71  Pulse: 88  Temp: 98.3 F (36.8 C)  Resp: 18     MDM   Final diagnoses:  Costochondritis    Patient presents with intermittent left-sided chest pain that is worse with deep breathing and movement. Mild relief with ibuprofen at home. Denies any recent injury, fall, trauma. Exam revealed left chest wall TTP. Cardiac exam benign. Blood work unremarkable. Troponin negative. Pregnancy negative. Chest x-ray negative. EKG shows normal sinus rhythm. PERC negative. I have a low suspicion for ACS, PE, dissection, or other acute cardiac event at this time. I suspect pain is likely due to costochondritis. Plan to D/C patient home with NSAID. Patient given resource guide to follow up with PCP.  Evaluation does not show pathology requring ongoing emergent intervention or admission. Pt is hemodynamically stable and mentating appropriately. Discussed findings/results and plan with  patient/guardian, who agrees with plan. All questions answered. Return precautions discussed and outpatient follow up given.        Chesley Noon [place], [place] 01/09/15 1943  Wandra Arthurs, MD 01/10/15 [phone]

## 2015-01-09 NOTE — Discharge Instructions (Signed)
Take your medications as prescribed. Follow-up with a primary care provider listed in the resource guide provided below. Please return to the emergency department if symptoms worsen.   Emergency Department Resource Guide 1) Find a Doctor and Pay Out of Pocket Although you won't have to find out who is covered by your insurance plan, it is a good idea to ask around and get recommendations. You will then need to call the office and see if the doctor you have chosen will accept you as a new patient and what types of options they offer for patients who are self-pay. Some doctors offer discounts or will set up payment plans for their patients who do not have insurance, but you will need to ask so you aren't surprised when you get to your appointment.  2) Contact Your Local Health Department Not all health departments have doctors that can see patients for sick visits, but many do, so it is worth a call to see if yours does. If you don't know where your local health department is, you can check in your phone book. The CDC also has a tool to help you locate your state's health department, and many state websites also have listings of all of their local health departments.  3) Find a [facility] If your illness is not likely to be very severe or complicated, you may want to try a walk in clinic. These are popping up all over the country in pharmacies, drugstores, and shopping centers. They're usually staffed by nurse practitioners or physician assistants that have been trained to treat common illnesses and complaints. They're usually fairly quick and inexpensive. However, if you have serious medical issues or chronic medical problems, these are probably not your best option.  No Primary Care Doctor: - Call Health Connect at  [phone] - they can help you locate a primary care doctor that  accepts your insurance, provides certain services, etc. - Physician Referral Service- [phone]  Chronic Pain  Problems: Organization         Address  Phone   Notes  [facility]  [phone] Patients need to be referred by their primary care doctor.   Medication Assistance: Organization         Address  Phone   Notes  [facility] Medication [facility] [address] [place], [place] [address] [phone] --Must be a resident of [facility] -- Must have NO insurance coverage whatsoever (no Medicaid/ Medicare, etc.) -- The pt. MUST have a primary care doctor that directs their care regularly and follows them in the community   [facility]  [phone]   [employer]  [phone]    Agencies that provide inexpensive medical care: Organization         Address  Phone   Notes  [place]  [phone]   [facility]    [phone]   [facility] [place], [place] [address] [phone]   [place] [address], [place] [phone]   Planned Parenthood    [phone]   [facility]    [phone]   [place] [address], [place] Phone:  [phone], Fax:  [phone] Hours of Operation:  9 am - 6 pm, M-F.  Also accepts Medicaid/Medicare and self-pay.  [facility] for [place] [address], [address], [place] Phone: [phone], Fax: [phone]. Hours of Operation:  8:30 am - 5:30 pm, M-F.  Also accepts Medicaid and self-pay.  [facility] [place] [address] [place] Phone: [phone]   [place], [place], [place] [phone], Ext. [phone] Mondays & Thursdays: 7-9 AM.  First 15 patients are seen on a first come, first serve basis.    [place] Providers:  Organization         Address  Phone   Notes  [facility] [address], [place] [phone] Also  accepts self-pay patients.  [facility] [address], [place]  [phone]   [place], [address], [place] [phone]   [facility] [address] [place] [phone]   Lucianne Lei [address], [place]   [phone] Only accepts [place] Access [place] patients after they have their name applied to their card.   Self-Pay (no insurance) in [facility]:  Organization         Address  Phone   Notes  Sickle Cell Patients, [facility] [place] [phone]   [facility] [place] [phone]   [facility] [place]  [place], [place], [place] [phone]   [facility]/Dr. Osei-Bonsu  [address] [place] or [address], [place] [phone] Phone number for both [place] and [place] locations is the same.  [facility] [address] [place] [phone]   [facility] [address] [place] or [address] [phone]   [facility] [address] [place] [phone], phone; [phone], fax Sees patients 1st and 3rd Saturday of every month.  Must not qualify for public or private insurance (i.e. Medicaid, Medicare, [place] Health Choice, Veterans' Benefits)  Household income should be no more than 200% of the poverty level The clinic cannot treat you if you are pregnant or think you are pregnant  Sexually transmitted diseases are not treated at the clinic.    Dental Care: Organization         Address  Phone  Notes  [facility] Department [facility] [place] [phone] Accepts children up to age 80 who are enrolled in [place] or [place]; pregnant  women with a Medicaid card; and children who have applied for Medicaid or [place] Health Choice, but were declined, whose parents can pay a reduced fee at time of service.  [facility] Department of [facility]  [address], [place] [phone] Accepts children up to age 55 who are enrolled in [place] or [place]; pregnant women with a Medicaid card; and children who have applied for Medicaid or [place] Health Choice, but were declined, whose parents can pay a reduced fee at time of service.  [facility]  [place] [phone] Patients are seen by appointment only. Walk-ins are not accepted. [place] will see patients 4 years of age and older. Monday - Tuesday (8am-5pm) Most Wednesdays (8:30-5pm) $30 per visit, cash only  [place]  [address], [place] [phone] Patients are seen by appointment only. Walk-ins are not accepted. [facility] will see patients 18 years of age and older. °One Wednesday Evening (Monthly: Volunteer Based).  $30 per visit, cash only  °[facility]  [phone] for adults; Children under age 4, call Graduate Pediatric Dentistry at [phone]. Children aged 4-14, please call [phone] to request a pediatric application. ° Dental services are provided in all areas of dental care including fillings, crowns and bridges, complete and partial dentures, implants, gum treatment, root canals, and extractions. Preventive care is also provided. Treatment is provided to both adults and children. °Patients are selected via a lottery and there is often a waiting list. °  °[facility] [address], °[place] ° ([phone] [email or web address] °  °[facility] [address], [place], [place] [phone], Ext. [phone] Second and Fourth Thursday of each month, opens at 6:30 AM; Clinic ends at 9 AM.  Patients are  seen on a first-come first-served basis, and a limited number are seen during each clinic.  ° °[facility] ° [address], [place], [place] [phone]   Eligibility Requirements °You must have lived in [place], [place], or [place] counties for at least the last three months. °  You cannot be eligible for state or federal sponsored healthcare insurance, including Veterans Administration, Medicaid, or Medicare. °  You generally cannot be eligible for healthcare insurance through your employer.  °  How to apply: °Eligibility screenings are held every Tuesday and Wednesday afternoon from 1:00 pm until 4:00 pm. You do not need an appointment for the interview!  °[facility] [address], [place], [place] [phone]   °[facility]  [phone]   °[facility]  [phone]   °[facility]  336-570-6415   ° °Behavioral Health Resources in the Community: °Intensive Outpatient Programs °Organization         Address  Phone  Notes  °[facility] [address], [place], [place] [phone]   °[facility] [address], [place], [place] [phone]   °[facility] [address], [place], [place] ° 336-882-2125   °[facility] [address],  °[place], [place] 1-800-853-5163 or 336-641-4981   °Substance Abuse Resources °Organization         Address  Phone  Notes  °[facility]  336-882-2125   °[facility]  336-784-9470   °[facility]  336-285-9073   °[facility]  336-845-3988   °Residential & Outpatient Substance Abuse Program  1-800-659-3381   °Psychological Services °Organization         Address  Phone  Notes  °[facility]  336- [phone]   °[facility]  336- 378-7881   °[facility] [address], [place] 1-800-853-5163 or 336-641-4981   ° °Mobile Crisis  Teams °Organization         Address  Phone  Notes  °[facility], Mobile Crisis Care Unit  1-877-626-1772   °Assertive °Psychotherapeutic Services ° [address] [place], [place] 336-834-9664   °Sharon DeEsch [address] °[place] 336-554-5454   ° °Self-Help/Support Groups °Organization         Address  Phone             Notes  °[facility] [place] -   variety of support groups  336- (206) 361-2796 Call for more information  Narcotics Anonymous (NA), [facility] [address], [employer] [place]  2 meetings at this location   Residential Facilities manager         Address  Phone  Notes  [facility] [place],    [place]  1-812-257-9218   [facility]  [address], [place] [address], [place], [place]   [place], [place] [phone] Admissions: 8am-3pm M-F  [facility] Substance [place] [address],    [place], [place] [phone]   [facility] [address] [place], [place], [place]   [facility] [address]  [place], [place]   [facility] - Intensive Outpatient [address], [place], [place]   [facility] ([place].,  [place], [place] [phone] or ([phone]   [facility] ([facility]) [address], [place], [place] Accepts Medicaid  [facility] [place] [address],  [place] 1-(843) [phone] Substance Abuse/Addiction Treatment   [facility] Organization         Address  Phone  Notes  [facility]  3305263821   Domenic Schwab, PhD [address] [place], [place]   [phone] or [phone]   [place], [place] [phone])[phone]   [facility] [address], [place], [place] [phone]  Insurance/Medicaid/sponsorship through [facility] [address] [place]                                    [place], [place] [phone] [place] [address], [place] [phone]    Dr. Adele Schilder  ([phone]   [facility] Dept. 1) [address] [place] 2) [place] 3)  [place], [place] [phone]  [phone]   [place] [phone] or [phone] (After Hours)

## 2015-01-12 ENCOUNTER — Encounter (HOSPITAL_BASED_OUTPATIENT_CLINIC_OR_DEPARTMENT_OTHER): Payer: Self-pay | Admitting: *Deleted

## 2015-01-12 ENCOUNTER — Emergency Department (HOSPITAL_BASED_OUTPATIENT_CLINIC_OR_DEPARTMENT_OTHER)
Admission: EM | Admit: 2015-01-12 | Discharge: 2015-01-12 | Disposition: A | Discharge status: Home / Self Care | Payer: Medicaid Other | Attending: Emergency Medicine | Admitting: Emergency Medicine

## 2015-01-12 DIAGNOSIS — R0982 Postnasal drip: Secondary | ICD-10-CM | POA: Insufficient documentation

## 2015-01-12 DIAGNOSIS — R6883 Chills (without fever): Secondary | ICD-10-CM | POA: Insufficient documentation

## 2015-01-12 DIAGNOSIS — J3489 Other specified disorders of nose and nasal sinuses: Secondary | ICD-10-CM | POA: Insufficient documentation

## 2015-01-12 DIAGNOSIS — H04209 Unspecified epiphora, unspecified lacrimal gland: Secondary | ICD-10-CM

## 2015-01-12 DIAGNOSIS — Z791 Long term (current) use of non-steroidal anti-inflammatories (NSAID): Secondary | ICD-10-CM | POA: Insufficient documentation

## 2015-01-12 DIAGNOSIS — J45909 Unspecified asthma, uncomplicated: Secondary | ICD-10-CM | POA: Insufficient documentation

## 2015-01-12 DIAGNOSIS — Z862 Personal history of diseases of the blood and blood-forming organs and certain disorders involving the immune mechanism: Secondary | ICD-10-CM | POA: Insufficient documentation

## 2015-01-12 DIAGNOSIS — R220 Localized swelling, mass and lump, head: Secondary | ICD-10-CM | POA: Insufficient documentation

## 2015-01-12 DIAGNOSIS — H578 Other specified disorders of eye and adnexa: Secondary | ICD-10-CM | POA: Insufficient documentation

## 2015-01-12 MED ORDER — LORATADINE 10 MG PO TABS
ORAL_TABLET | ORAL | Status: AC
  Filled 2015-01-12: qty 1

## 2015-01-12 MED ORDER — LORATADINE 10 MG PO TABS
10.0000 mg | ORAL_TABLET | Freq: Once | ORAL | Status: AC
  Administered 2015-01-12: 10 mg via ORAL

## 2015-01-12 MED ORDER — LORATADINE 10 MG PO TABS: 10.0000 mg | ORAL_TABLET | Freq: Every day | ORAL | Status: DC

## 2015-01-12 MED ORDER — TETRACAINE HCL 0.5 % OP SOLN
OPHTHALMIC | Status: AC
  Filled 2015-01-12: qty 2

## 2015-01-12 MED ORDER — FLUORESCEIN SODIUM 1 MG OP STRP
ORAL_STRIP | OPHTHALMIC | Status: AC
  Filled 2015-01-12: qty 1

## 2015-01-12 NOTE — Discharge Instructions (Signed)
°Emergency Department Resource Guide °1) Find a Doctor and Pay Out of Pocket °Although you won't have to find out who is covered by your insurance plan, it is a good idea to ask around and get recommendations. You will then need to call the office and see if the doctor you have chosen will accept you as a new patient and what types of options they offer for patients who are self-pay. Some doctors offer discounts or will set up payment plans for their patients who do not have insurance, but you will need to ask so you aren't surprised when you get to your appointment. ° °2) Contact Your Local Health Department °Not all health departments have doctors that can see patients for sick visits, but many do, so it is worth a call to see if yours does. If you don't know where your local health department is, you can check in your phone book. The CDC also has a tool to help you locate your state's health department, and many state websites also have listings of all of their local health departments. ° °3) Find a Walk-in Clinic °If your illness is not likely to be very severe or complicated, you may want to try a walk in clinic. These are popping up all over the country in pharmacies, drugstores, and shopping centers. They're usually staffed by nurse practitioners or physician assistants that have been trained to treat common illnesses and complaints. They're usually fairly quick and inexpensive. However, if you have serious medical issues or chronic medical problems, these are probably not your best option. ° °No Primary Care Doctor: °- Call [facility] at  832-8000 - they can help you locate a primary care doctor that  accepts your insurance, provides certain services, etc. °- Physician Referral Service- 1-800-533-3463 ° °Chronic Pain Problems: °Organization         Address  Phone   Notes  °[facility]  (336) 297-2271 Patients need to be referred by their primary care doctor.  ° °Medication  Assistance: °Organization         Address  Phone   Notes  °[place] [facility] [address] °[place], [place] [address] (336) 641-8030 --Must be a resident of [place] °-- Must have NO insurance coverage whatsoever (no Medicaid/ Medicare, etc.) °-- The pt. MUST have a primary care doctor that directs their care regularly and follows them in the community °  °[facility]  (866) 331-1348   °[employer]  (888) 892-1162   ° °Agencies that provide inexpensive medical care: °Organization         Address  Phone   Notes  °[facility]  (336) 832-8035   °[facility]    (336) 832-7272   °[facility] [address] °[place],  [address] (336) 832-4777   °[facility] [address], °[place] (336) 271-4999   °[facility]    (336) 373-0678   °[facility]    (336) 272-1050   °[facility] ° [address], [place] Phone:  (336) 832-4444, Fax:  (336) 832-4440 Hours of Operation:  9 am - 6 pm, M-F.  Also accepts Medicaid/Medicare and self-pay.  °[facility] ° [address], [place] Phone: (336) 832-3150, Fax: (336) 832-3151. Hours of Operation:  8:30 am - 5:30 pm, M-F.  Also accepts Medicaid and self-pay.  °[facility] [place] [address], [place] Phone: (336) 878-6027   °[facility] [address], [place], [place] (336)723-1848, Ext. 123 Mondays & Thursdays: 7-9 AM.  First 15 patients are seen on a first come, first serve basis. °  ° °Medicaid-accepting [place] Providers: ° °Organization         Address  Phone   Notes  °[facility] [address], [place] (336) 641-2100 Also accepts self-pay patients.  °[facility] [address], [place] ° (336) 856-9996   °[facility] [address], [place]  (336) 288-8857   °[facility] [address] [place] [address], [place] (336) 299-7000   °Veita Bland [address], [place]  ° (336) 373-1557 Only accepts [place] Access Medicaid patients after they have their name applied to their card.  ° °Self-Pay (no insurance) in [place]: ° °Organization         Address  Phone   Notes  °Sickle Cell Patients, [facility] [address], [place] (336) 832-1970   °[facility] [address], [place] (336) 832-4400   °[facility] ° [address],  (336) 992-4800   °[facility]/Dr. Osei-Bonsu ° [address] [place] [address], [place] or [address], [place] (336) 841-8500 Phone number for both [place] and [place] locations is the same.  °[facility] [address], [place] (336) 299-0000   °[facility] [address] [place] [address], [place] or [address] (336) 852-7530 °(336) 878-2260   °[facility] [address], [place] (336) 350-1642, phone; (336) 294-5005, fax Sees patients 1st and 3rd Saturday of every month.  Must not qualify for public or private insurance (i.e. Medicaid, Medicare, [place] Health Choice, Veterans' Benefits) • Household income should be no more than 200% of the poverty level •The clinic cannot treat you if you are pregnant or think you are pregnant • Sexually transmitted diseases are not treated at the clinic.  ° ° °Dental Care: °Organization         Address  Phone  Notes  °[place] [facility] [address], [place] (336) 641-6152 Accepts children up to age 21 who are enrolled in Medicaid or [place] Health Choice; pregnant women with a Medicaid card; and children who have applied for Medicaid or [place] Health Choice, but were declined, whose parents can pay a reduced fee at time of service.  °[place]  [facility] [place]  [address], [place] (336) 641-7733 Accepts children up to age 21 who are enrolled in Medicaid or [place] Health Choice; pregnant women with a Medicaid card; and children who have applied for Medicaid or [place] Health Choice, but were declined, whose parents can pay a reduced fee at time of service.  °[facility] ° [address],  (336) 641-4533 Patients are seen by appointment only. Walk-ins are not accepted. [facility] will see patients 18 years of age and older. °Monday - Tuesday (8am-5pm) °Most Wednesdays (8:30-5pm) °$30 per visit, cash only  °[facility] ° [address], [place] (336) 641-4533 Patients are seen by appointment only. Walk-ins are not accepted. [facility] will see patients 18 years of age and older. °One   Wednesday Evening (Monthly: Volunteer Based).  $30 per visit, cash only  °[facility]  (919) 537-3737 for adults; Children under age 4, call Graduate Pediatric Dentistry at (919) [phone]. Children aged 4-14, please call (919) [phone] to request a pediatric application. ° Dental services are provided in all areas of dental care including fillings, crowns and bridges, complete and partial dentures, implants, gum treatment, root canals, and extractions. Preventive care is also provided. Treatment is provided to both adults and children. °Patients are selected via a lottery and there is often a waiting list. °  °[facility] [address], °[place] ° (336) 763-8833 [email or web address] °  °[facility] [address], [place], [place] (336)723-1848, Ext. 123 Second and Fourth Thursday of each month, opens at 6:30 AM; Clinic ends at 9 AM.  Patients are seen on a first-come first-served basis, and a limited number are seen during each clinic.  ° °[facility] ° [address], [place], [place] (336) 723-7904    Eligibility Requirements °You must have lived in [place], [place], or [place] counties for at least the last three months. °  You cannot be eligible for state or federal sponsored healthcare insurance, including Veterans Administration, Medicaid, or Medicare. °  You generally cannot be eligible for healthcare insurance through your employer.  °  How to apply: °Eligibility screenings are held every Tuesday and Wednesday afternoon from 1:00 pm until 4:00 pm. You do not need an appointment for the interview!  °[facility] [address], [place], [place] 336-631-2330   °[facility]  336-342-8273   °[facility]  336-703-3100   °[facility]  336-570-6415   ° °Behavioral Health Resources in the Community: °Intensive Outpatient Programs °Organization         Address  Phone  Notes  °[facility] [address], [place], [place] 336-878-6098   °[facility] [address], [place], [place] 336-832-9800   °[facility] [address], [place], [place] ° 336-882-2125   °[facility] [address],  °[place], [place] 1-800-853-5163 or 336-641-4981   °Substance Abuse Resources °Organization         Address  Phone  Notes  °[facility]  336-882-2125   °[facility]  336-784-9470   °[facility]  336-285-9073   °[facility]  336-845-3988   °Residential & Outpatient Substance Abuse Program  1-800-659-3381   °Psychological Services °Organization         Address  Phone  Notes  ° Health  336- 832-9600   °[facility]  336- 378-7881   °[facility] [address], [place] 1-800-853-5163 or 336-641-4981   ° °Mobile Crisis Teams °Organization         Address  Phone  Notes  °[facility], Mobile Crisis Care Unit  1-877-626-1772   °Assertive °[facility] ° [address]  [place], [place] 336-834-9664   °Sharon DeEsch [address] °[place] 336-554-5454   ° °Self-Help/Support Groups °Organization         Address  Phone             Notes  °[facility]  - variety of support groups  336- 373-1402 Call for more information  °Narcotics Anonymous (NA), [facility] [address], °[place]  2 meetings at this location  ° °  Residential Treatment Programs °Organization         Address  Phone  Notes  °[facility] [address],    °[place]  1-866-801-8205   °[facility] ° [address], [place], [place] 704-293-8524   °[facility] [address], [place] 336-845-3988 Admissions: 8am-3pm M-F  °[facility] [address]    °[place], [place] 336-841-1104   °[facility] [address], [place], [place] 336-379-7146   °[facility] [address]  °[place], [place] 336-285-9073   °[facility] Intensive Outpatient [address], [place], [place] 336-852-3033   °[facility] [address]  °[place], [place] 1-877-615-2722 or 336-784-9470   °[facility] [address] [place], [place] 336-227-7417 Accepts Medicaid  °[facility] [address]  °[place] 1-800-659-3381 Substance Abuse/Addiction Treatment  ° °[place] Behavioral Health Resources °Organization         Address  Phone  Notes  °[facility]  (888) 581-9988   °Julie Brannon, PhD [address] [place], [place]   (336) 349-5553 or (336) 951-0000   °[facility]   [address] °[place], [place] (336) 349-4454   °[facility] [address], [place], [place] (336) 342-8316 Insurance/Medicaid/sponsorship through [facility]  °[facility] [address]                                    [place], [place] (336) 342-8316 Therapy/tele-psych/case    °[facility] [address]  ° [place], [place] (336) 349-2233    °Dr. Arfeen  (336) 349-4544   °[facility] [place]  [employer] [place] [facility] 1) [address], [place] °2) [address], [place] °3)  [address], [place] (336) 349-3220 °(336) 342-7768 ° °(336) 342-8140   °[place] [facility] (336) 342-1394 or (336) 342-3537 (After Hours)    ° ° °

## 2015-01-12 NOTE — ED Provider Notes (Signed)
CSN: [id number]     Arrival date & time 01/12/15  0152 History   None    No chief complaint on file.    (Consider location/radiation/quality/duration/timing/severity/associated sxs/prior Treatment) Patient is a 29 y.o. female presenting with eye problem.  Eye Problem Location:  L eye Quality:  Tearing and aching Severity:  Mild Onset quality:  Gradual Duration:  3 hours Chronicity:  New Context: contact lenses   Context: not burn, not chemical exposure, not foreign body and not using machinery   Relieved by:  None tried Worsened by:  Nothing tried Ineffective treatments:  None tried Associated symptoms: redness, swelling and tearing   Associated symptoms: no crusting, no decreased vision, no discharge, no double vision, no facial rash, no headaches, no inflammation, no nausea and no numbness     Past Medical History  Diagnosis Date  . Asthma   . Anemia    Past Surgical History  Procedure Laterality Date  . Cesarean section     No family history on file. Social History  Substance Use Topics  . Smoking status: Never Smoker   . Smokeless tobacco: Not on file  . Alcohol Use: No   OB History    No data available     Review of Systems  Constitutional: Positive for chills. Negative for fever.  HENT: Positive for congestion, facial swelling, postnasal drip and rhinorrhea. Negative for nosebleeds.   Eyes: Positive for redness. Negative for double vision and discharge.  Respiratory: Positive for cough.   Gastrointestinal: Negative for nausea.  Neurological: Negative for numbness and headaches.  All other systems reviewed and are negative.     Allergies  Review of patient's allergies indicates no known allergies.  Home Medications   Prior to Admission medications   Medication Sig Start Date End Date Taking? Authorizing Provider  naproxen (NAPROSYN) 500 MG tablet Take 1 tablet (500 mg total) by mouth 2 (two) times daily. 01/09/15   Nona Dell, PA-C    LMP 12/03/2014 Physical Exam  Constitutional: She is oriented to person, place, and time. She appears well-developed and well-nourished.  HENT:  Head: Normocephalic and atraumatic.  Eyes: EOM are normal. Right eye exhibits no discharge and no exudate. Left eye exhibits no discharge and no exudate. Right conjunctiva is not injected. Right conjunctiva has no hemorrhage. Left conjunctiva is injected. Left conjunctiva has no hemorrhage. Right eye exhibits normal extraocular motion and no nystagmus. Left eye exhibits normal extraocular motion and no nystagmus. Right pupil is round and reactive. Left pupil is round and reactive.  Slight swelling of left eye lid  Cardiovascular: Normal rate and regular rhythm.   Pulmonary/Chest: Effort normal and breath sounds normal. No respiratory distress.  Abdominal: Soft. She exhibits no distension. There is no tenderness. There is no rebound.  Musculoskeletal: Normal range of motion. She exhibits no edema or tenderness.  Neurological: She is alert and oriented to person, place, and time.  Skin: Skin is warm and dry.  Nursing note and vitals reviewed.   ED Course  Procedures (including critical care time) Labs Review Labs Reviewed - No data to display  Imaging Review No results found. I have personally reviewed and evaluated these images and lab results as part of my medical decision-making.   EKG Interpretation None      MDM   Final diagnoses:  Nasal discharge with watery eyes   29 yo F w/ likely uri v allergic conjunctivitis and eyelid edema. No e/o cellulitis. Doesn't want to take ot contacts to  do tonometer/woods lamp so will hold off for now as foreign body/abrasion is unlikely anyway, doubt glaucoma with reactive pupils.  Will tx for allergic conjunctivitis.   I have personally and contemperaneously reviewed labs and imaging and used in my decision making as above.   A medical screening exam was performed and I feel the patient has had  an appropriate workup for their chief complaint at this time and likelihood of emergent condition existing is low. They have been counseled on decision, discharge, follow up and which symptoms necessitate immediate return to the emergency department. They or their family verbally stated understanding and agreement with plan and discharged in stable condition.      Merrily Pew, MD 01/12/15 [phone]

## 2015-01-12 NOTE — ED Notes (Signed)
C/o L eye redness, irritation, watery, onset 2000, L eye red and inflamed, tearing (clear), admits to some blurriness, (denies: pain, itching or drainage), "unsure of fever, felt cold all day", wears contacts (soft, has them in now), eye doctor is Dr. Vevelyn Francois in [place], [place].

## 2015-01-28 ENCOUNTER — Emergency Department (HOSPITAL_BASED_OUTPATIENT_CLINIC_OR_DEPARTMENT_OTHER)
Admission: EM | Admit: 2015-01-28 | Discharge: 2015-01-29 | Disposition: A | Discharge status: Home / Self Care | Payer: Managed Care, Other (non HMO) | Attending: Emergency Medicine | Admitting: Emergency Medicine

## 2015-01-28 ENCOUNTER — Emergency Department (HOSPITAL_BASED_OUTPATIENT_CLINIC_OR_DEPARTMENT_OTHER): Payer: Managed Care, Other (non HMO)

## 2015-01-28 ENCOUNTER — Encounter (HOSPITAL_BASED_OUTPATIENT_CLINIC_OR_DEPARTMENT_OTHER): Payer: Self-pay | Admitting: Emergency Medicine

## 2015-01-28 DIAGNOSIS — Z3A01 Less than 8 weeks gestation of pregnancy: Secondary | ICD-10-CM | POA: Diagnosis not present

## 2015-01-28 DIAGNOSIS — O99011 Anemia complicating pregnancy, first trimester: Secondary | ICD-10-CM | POA: Diagnosis not present

## 2015-01-28 DIAGNOSIS — O99511 Diseases of the respiratory system complicating pregnancy, first trimester: Secondary | ICD-10-CM | POA: Diagnosis not present

## 2015-01-28 DIAGNOSIS — O209 Hemorrhage in early pregnancy, unspecified: Secondary | ICD-10-CM | POA: Diagnosis present

## 2015-01-28 DIAGNOSIS — J45909 Unspecified asthma, uncomplicated: Secondary | ICD-10-CM | POA: Diagnosis not present

## 2015-01-28 DIAGNOSIS — D649 Anemia, unspecified: Secondary | ICD-10-CM | POA: Diagnosis not present

## 2015-01-28 DIAGNOSIS — O468X1 Other antepartum hemorrhage, first trimester: Secondary | ICD-10-CM

## 2015-01-28 DIAGNOSIS — O200 Threatened abortion: Secondary | ICD-10-CM | POA: Diagnosis not present

## 2015-01-28 DIAGNOSIS — O418X10 Other specified disorders of amniotic fluid and membranes, first trimester, not applicable or unspecified: Secondary | ICD-10-CM

## 2015-01-28 DIAGNOSIS — Z79899 Other long term (current) drug therapy: Secondary | ICD-10-CM | POA: Insufficient documentation

## 2015-01-28 LAB — URINALYSIS, ROUTINE W REFLEX MICROSCOPIC
BILIRUBIN URINE: NEGATIVE
Glucose, UA: NEGATIVE mg/dL
Hgb urine dipstick: NEGATIVE
KETONES UR: NEGATIVE mg/dL
Leukocytes, UA: NEGATIVE
Nitrite: NEGATIVE
PROTEIN: NEGATIVE mg/dL
SPECIFIC GRAVITY, URINE: 1.034 — AB (ref 1.005–1.030)
Urobilinogen, UA: 1 mg/dL (ref 0.0–1.0)
pH: 6.5 (ref 5.0–8.0)

## 2015-01-28 LAB — PREGNANCY, URINE: PREG TEST UR: POSITIVE — AB

## 2015-01-28 NOTE — ED Notes (Signed)
Pt drove back from [place] today.  Is [redacted] wks pregnant.  Low abd cramping since 3pm today.  Noticed vaginal bleeding 30 min ago.  Sts enough to fill a tampon.

## 2015-01-28 NOTE — ED Provider Notes (Signed)
CSN: [id number]     Arrival date & time 01/28/15  2152 History  By signing my name below, I, Nancy Schultz, attest that this documentation has been prepared under the direction and in the presence of Merryl Hacker, MD. Electronically Signed: Eustaquio Schultz, ED Scribe. 01/28/2015. 11:41 PM.  Chief Complaint  Patient presents with  . Bleeding in pregnancy    The history is provided by the patient. No language interpreter was used.     HPI Comments: Nancy Schultz is a 29 y.o. female who is approximately [redacted] weeks pregnant, presents to the Emergency Department complaining of gradual onset, constant, 4/10, lower abdominal cramping that began earlier today, gradually worsening. Pt also complains of dark red blood that could fill approximately 1 tampon. She cannot say exactly when the vaginal bleeding began but states it was sometime today. Denies any other associated symptoms. LNMP: 12/13/2014, estimated [redacted] weeks pregnant. G2P1. Pt does not know her blood type. She did not need a rhogam shot during first pregnancy.    Past Medical History  Diagnosis Date  . Asthma   . Anemia   . Anemia    Past Surgical History  Procedure Laterality Date  . Cesarean section     No family history on file. Social History  Substance Use Topics  . Smoking status: Never Smoker   . Smokeless tobacco: None  . Alcohol Use: No   OB History    Gravida Para Term Preterm AB TAB SAB Ectopic Multiple Living   1              Review of Systems  Constitutional: Negative for fever.  Gastrointestinal: Positive for abdominal pain. Negative for nausea, vomiting and diarrhea.  Genitourinary: Positive for vaginal bleeding. Negative for dysuria.  All other systems reviewed and are negative.  Allergies  Review of patient's allergies indicates no known allergies.  Home Medications   Prior to Admission medications   Medication Sig Start Date End Date Taking? Authorizing Provider  ferrous sulfate 325 (65 FE) MG  tablet Take 325 mg by mouth daily with breakfast.   Yes Historical Provider, MD  loratadine (CLARITIN) 10 MG tablet Take 1 tablet (10 mg total) by mouth daily. 01/12/15 02/12/15  Merrily Pew, MD  naproxen (NAPROSYN) 500 MG tablet Take 1 tablet (500 mg total) by mouth 2 (two) times daily. 01/09/15   Nona Dell, PA-C   Triage VItals: BP 112/84 mmHg  Pulse 82  Temp(Src) 98.3 F (36.8 C) (Oral)  Resp 16  Ht 5\' 5"  (1.651 m)  Wt 180 lb (81.647 kg)  BMI 29.95 kg/m2  SpO2 99%  LMP 12/03/2014   Physical Exam  Constitutional: She is oriented to person, place, and time. She appears well-developed and well-nourished. No distress.  HENT:  Head: Normocephalic and atraumatic.  Cardiovascular: Normal rate, regular rhythm and normal heart sounds.   No murmur heard. Pulmonary/Chest: Effort normal and breath sounds normal. No respiratory distress. She has no wheezes.  Abdominal: Soft. Bowel sounds are normal. There is no tenderness. There is no rebound and no guarding.  Genitourinary: Vagina normal.  External vaginal exam normal, no active bleeding or dried blood noted in the vaginal vault, cervical os is closed, no adnexal tenderness or fullness  Neurological: She is alert and oriented to person, place, and time.  Skin: Skin is warm and dry.  Psychiatric: She has a normal mood and affect.  Nursing note and vitals reviewed.   ED Course  Procedures (including critical care time)  DIAGNOSTIC STUDIES: Oxygen Saturation is 99% on RA, normal by my interpretation.    COORDINATION OF CARE: 11:39 PM-Discussed treatment plan which includes pelvic exam with pt at bedside and pt agreed to plan.   Labs Review Labs Reviewed  WET PREP, GENITAL - Abnormal; Notable for the following:    WBC, Wet Prep HPF POC FEW (*)    All other components within normal limits  URINALYSIS, ROUTINE W REFLEX MICROSCOPIC (NOT AT [facility]) - Abnormal; Notable for the following:    APPearance CLOUDY (*)    Specific  Gravity, Urine 1.034 (*)    All other components within normal limits  PREGNANCY, URINE - Abnormal; Notable for the following:    Preg Test, Ur POSITIVE (*)    All other components within normal limits  HCG, QUANTITATIVE, PREGNANCY - Abnormal; Notable for the following:    hCG, Beta Chain, Quant, S 35451 (*)    All other components within normal limits  GC/CHLAMYDIA PROBE AMP ([place]) NOT AT [facility]    Imaging Review US Ob Comp Less 14 Wks  01/29/2015  CLINICAL DATA:  Recent bleeding, history of positive pregnancy test EXAM: OBSTETRIC <14 WK [place] AND TRANSVAGINAL OB US TECHNIQUE: Both transabdominal and transvaginal ultrasound examinations were performed for complete evaluation of the gestation as well as the maternal uterus, adnexal regions, and pelvic cul-de-sac. Transvaginal technique was performed to assess early pregnancy. COMPARISON:  None. FINDINGS: Intrauterine gestational sac: Visualized/normal in shape. Yolk sac:  Present Embryo:  Present Cardiac Activity: Present Heart Rate: 115  bpm CRL:  5  mm   6 w   2 d                  [place] EDC: 09/20/2015 Maternal uterus/adnexae: A small subchorionic hemorrhage is noted. The ovaries are within normal limits. Small amount of free pelvic fluid is noted. IMPRESSION: Small subchorionic hemorrhage. Single live intrauterine gestation at 6 weeks 2 days Electronically Signed   By: Inez Catalina M.D.   On: 01/29/2015 01:10   US Ob Transvaginal  01/29/2015  CLINICAL DATA:  Recent bleeding, history of positive pregnancy test EXAM: OBSTETRIC <14 WK [place] AND TRANSVAGINAL OB US TECHNIQUE: Both transabdominal and transvaginal ultrasound examinations were performed for complete evaluation of the gestation as well as the maternal uterus, adnexal regions, and pelvic cul-de-sac. Transvaginal technique was performed to assess early pregnancy. COMPARISON:  None. FINDINGS: Intrauterine gestational sac: Visualized/normal in shape. Yolk sac:  Present Embryo:  Present Cardiac  Activity: Present Heart Rate: 115  bpm CRL:  5  mm   6 w   2 d                  [place] EDC: 09/20/2015 Maternal uterus/adnexae: A small subchorionic hemorrhage is noted. The ovaries are within normal limits. Small amount of free pelvic fluid is noted. IMPRESSION: Small subchorionic hemorrhage. Single live intrauterine gestation at 6 weeks 2 days Electronically Signed   By: Inez Catalina M.D.   On: 01/29/2015 01:10   I have personally reviewed and evaluated these lab results as part of my medical decision-making.   EKG Interpretation None      MDM   Final diagnoses:  Threatened miscarriage in early pregnancy  Subchorionic hematoma in first trimester   Patient presents with concerns for vaginal bleeding in early pregnancy. Nontoxic. Also endorses abdominal cramping that does not lateralize. No active bleeding noted on exam. Unfortunately tonight, we are unable to get quick beta hCG levels. She does  have a positive urine pregnancy. Patient was sent for ultrasound. There was an IUP noted to be 6 weeks and 2 days with a good heart rate. She has a small subchorionic hemorrhage. Send out beta before meals level still pending. Wet prep reassuring and GC chlamydia pending as well. Suspect patient's bleeding is secondary to subchorionic hemorrhage. Patient denies receiving Reglan during her first pregnancy. We do not have the ability to type and screen at this facility. She will need follow-up with her primary OB. Discussed with patient strict return precautions and precautions regarding threatened miscarriage. Patient stated understanding and will follow-up closely with her OB.  After history, exam, and medical workup I feel the patient has been appropriately medically screened and is safe for discharge home. Pertinent diagnoses were discussed with the patient. Patient was given return precautions.  I personally performed the services described in this documentation, which was scribed in my presence. The  recorded information has been reviewed and is accurate.      Merryl Hacker, MD 01/29/15 [phone]

## 2015-01-28 NOTE — ED Notes (Signed)
Patient states she is [redacted] weeks pregnant.  She states she had a positive home pregnancy test and then confirmed by an OB/Gyn MD.

## 2015-01-29 LAB — WET PREP, GENITAL
CLUE CELLS WET PREP: NONE SEEN
Trich, Wet Prep: NONE SEEN
Yeast Wet Prep HPF POC: NONE SEEN

## 2015-01-29 LAB — HCG, QUANTITATIVE, PREGNANCY: HCG, BETA CHAIN, QUANT, S: 35451 m[IU]/mL — AB (ref <5)

## 2015-01-29 NOTE — ED Notes (Signed)
Patient stable and ambulatory.  Patient verbalizes understanding of discharge follow-up and instructions.

## 2015-01-29 NOTE — Discharge Instructions (Signed)
You had an ultrasound that confirms you are 6 weeks and 2 days pregnant.  Your bleeding may be related to a small subchorionic hematoma.  See return precautions below.  Subchorionic Hematoma A subchorionic hematoma is a gathering of blood between the outer wall of the placenta and the inner wall of the womb (uterus). The placenta is the organ that connects the fetus to the wall of the uterus. The placenta performs the feeding, breathing (oxygen to the fetus), and waste removal (excretory work) of the fetus.  Subchorionic hematoma is the most common abnormality found on a result from ultrasonography done during the first trimester or early second trimester of pregnancy. If there has been little or no vaginal bleeding, early small hematomas usually shrink on their own and do not affect your baby or pregnancy. The blood is gradually absorbed over 1-2 weeks. When bleeding starts later in pregnancy or the hematoma is larger or occurs in an older pregnant woman, the outcome may not be as good. Larger hematomas may get bigger, which increases the chances for miscarriage. Subchorionic hematoma also increases the risk of premature detachment of the placenta from the uterus, preterm (premature) labor, and stillbirth. HOME CARE INSTRUCTIONS  Stay on bed rest if your health care provider recommends this. Although bed rest will not prevent more bleeding or prevent a miscarriage, your health care provider may recommend bed rest until you are advised otherwise.  Avoid heavy lifting (more than 10 lb [4.5 kg]), exercise, sexual intercourse, or douching as directed by your health care provider.  Keep track of the number of pads you use each day and how soaked (saturated) they are. Write down this information.  Do not use tampons.  Keep all follow-up appointments as directed by your health care provider. Your health care provider may ask you to have follow-up blood tests or ultrasound tests or both. SEEK IMMEDIATE  MEDICAL CARE IF:  You have severe cramps in your stomach, back, abdomen, or pelvis.  You have a fever.  You pass large clots or tissue. Save any tissue for your health care provider to look at.  Your bleeding increases or you become lightheaded, feel weak, or have fainting episodes.   This information is not intended to replace advice given to you by your health care provider. Make sure you discuss any questions you have with your health care provider.   Document Released: 07/01/2006 Document Revised: 04/06/2014 Document Reviewed: 10/13/2012 Elsevier Interactive Patient Education 2016 [employer].  Threatened Miscarriage A threatened miscarriage occurs when you have vaginal bleeding during your first 20 weeks of pregnancy but the pregnancy has not ended. If you have vaginal bleeding during this time, your health care provider will do tests to make sure you are still pregnant. If the tests show you are still pregnant and the developing baby (fetus) inside your womb (uterus) is still growing, your condition is considered a threatened miscarriage. A threatened miscarriage does not mean your pregnancy will end, but it does increase the risk of losing your pregnancy (complete miscarriage). CAUSES  The cause of a threatened miscarriage is usually not known. If you go on to have a complete miscarriage, the most common cause is an abnormal number of chromosomes in the developing baby. Chromosomes are the structures inside cells that hold all your genetic material. Some causes of vaginal bleeding that do not result in miscarriage include:  Having sex.  Having an infection.  Normal hormone changes of pregnancy.  Bleeding that occurs when an egg  implants in your uterus. RISK FACTORS Risk factors for bleeding in early pregnancy include:  Obesity.  Smoking.  Drinking excessive amounts of alcohol or caffeine.  Recreational drug use. SIGNS AND SYMPTOMS  Light vaginal bleeding.  Mild  abdominal pain or cramps. DIAGNOSIS  If you have bleeding with or without abdominal pain before 20 weeks of pregnancy, your health care provider will do tests to check whether you are still pregnant. One important test involves using sound waves and a computer (ultrasound) to create images of the inside of your uterus. Other tests include an internal exam of your vagina and uterus (pelvic exam) and measurement of your baby's heart rate.  You may be diagnosed with a threatened miscarriage if:  Ultrasound testing shows you are still pregnant.  Your baby's heart rate is strong.  A pelvic exam shows that the opening between your uterus and your vagina (cervix) is closed.  Your heart rate and blood pressure are stable.  Blood tests confirm you are still pregnant. TREATMENT  No treatments have been shown to prevent a threatened miscarriage from going on to a complete miscarriage. However, the right home care is important.  HOME CARE INSTRUCTIONS   Make sure you keep all your appointments for prenatal care. This is very important.  Get plenty of rest.  Do not have sex or use tampons if you have vaginal bleeding.  Do not douche.  Do not smoke or use recreational drugs.  Do not drink alcohol.  Avoid caffeine. SEEK MEDICAL CARE IF:  You have light vaginal bleeding or spotting while pregnant.  You have abdominal pain or cramping.  You have a fever. SEEK IMMEDIATE MEDICAL CARE IF:  You have heavy vaginal bleeding.  You have blood clots coming from your vagina.  You have severe low back pain or abdominal cramps.  You have fever, chills, and severe abdominal pain. MAKE SURE YOU:  Understand these instructions.  Will watch your condition.  Will get help right away if you are not doing well or get worse.   This information is not intended to replace advice given to you by your health care provider. Make sure you discuss any questions you have with your health care provider.     Document Released: 03/16/2005 Document Revised: 03/21/2013 Document Reviewed: 01/10/2013 Elsevier Interactive Patient Education [employer].

## 2015-01-30 LAB — GC/CHLAMYDIA PROBE AMP (~~LOC~~) NOT AT ARMC
CHLAMYDIA, DNA PROBE: NEGATIVE
Neisseria Gonorrhea: NEGATIVE

## 2015-04-11 ENCOUNTER — Encounter (HOSPITAL_BASED_OUTPATIENT_CLINIC_OR_DEPARTMENT_OTHER): Payer: Self-pay | Admitting: *Deleted

## 2015-04-11 ENCOUNTER — Emergency Department (HOSPITAL_BASED_OUTPATIENT_CLINIC_OR_DEPARTMENT_OTHER)
Admission: EM | Admit: 2015-04-11 | Discharge: 2015-04-11 | Disposition: A | Discharge status: Home / Self Care | Payer: Managed Care, Other (non HMO) | Attending: Emergency Medicine | Admitting: Emergency Medicine

## 2015-04-11 DIAGNOSIS — O98512 Other viral diseases complicating pregnancy, second trimester: Secondary | ICD-10-CM | POA: Diagnosis not present

## 2015-04-11 DIAGNOSIS — O9989 Other specified diseases and conditions complicating pregnancy, childbirth and the puerperium: Secondary | ICD-10-CM | POA: Diagnosis present

## 2015-04-11 DIAGNOSIS — R059 Cough, unspecified: Secondary | ICD-10-CM

## 2015-04-11 DIAGNOSIS — J45909 Unspecified asthma, uncomplicated: Secondary | ICD-10-CM | POA: Diagnosis not present

## 2015-04-11 DIAGNOSIS — R110 Nausea: Secondary | ICD-10-CM

## 2015-04-11 DIAGNOSIS — Z79899 Other long term (current) drug therapy: Secondary | ICD-10-CM | POA: Diagnosis not present

## 2015-04-11 DIAGNOSIS — B349 Viral infection, unspecified: Secondary | ICD-10-CM

## 2015-04-11 DIAGNOSIS — Z791 Long term (current) use of non-steroidal anti-inflammatories (NSAID): Secondary | ICD-10-CM | POA: Insufficient documentation

## 2015-04-11 DIAGNOSIS — Z3A17 17 weeks gestation of pregnancy: Secondary | ICD-10-CM | POA: Diagnosis not present

## 2015-04-11 DIAGNOSIS — R05 Cough: Secondary | ICD-10-CM

## 2015-04-11 DIAGNOSIS — O99512 Diseases of the respiratory system complicating pregnancy, second trimester: Secondary | ICD-10-CM | POA: Insufficient documentation

## 2015-04-11 MED ORDER — ACETAMINOPHEN 500 MG PO TABS
1000.0000 mg | ORAL_TABLET | Freq: Once | ORAL | Status: AC
  Administered 2015-04-11: 1000 mg via ORAL
  Filled 2015-04-11: qty 2

## 2015-04-11 MED ORDER — DOXYLAMINE-PYRIDOXINE 10-10 MG PO TBEC: DELAYED_RELEASE_TABLET | ORAL | Status: DC

## 2015-04-11 MED ORDER — LORATADINE 10 MG PO TABS: 10.0000 mg | ORAL_TABLET | Freq: Every day | ORAL | Status: DC | PRN

## 2015-04-11 MED ORDER — SALINE SPRAY 0.65 % NA SOLN: 2.0000 | NASAL | Status: DC | PRN

## 2015-04-11 NOTE — ED Notes (Signed)
Pt amb to triage with quick steady gait in nad. Pt reports cough and subjective temps with head congestion x 4 days. Pt states she [redacted] weeks pregnant, but has no related complaints.

## 2015-04-11 NOTE — ED Provider Notes (Signed)
CSN: [id number]     Arrival date & time 04/11/15  1752 History   First MD Initiated Contact with Patient 04/11/15 1950     Chief Complaint  Patient presents with  . Cough    HPI   Nancy Schultz is an 30 y.o. female G1P0 [redacted] week pregnant female who presents to the ED for evaluation of four days of cough, congestion, body aches, and nausea. She reports she has had a cough productive of clear sputum. She has also felt very congested with rhinorrhea. She denies fever or chills. SHe states she has intermittently felt nauseous but denies emesis. Denies diarrhea. Denies abdominal pain, vaginal bleeding, vaginal discharge. Denies dysuria or increased urinary frequency/urgency. She states she tried Tylenol Cold and Flu with no relief. She states her next OB appt is at the end of the month.   Past Medical History  Diagnosis Date  . Asthma   . Anemia   . Anemia    Past Surgical History  Procedure Laterality Date  . Cesarean section     History reviewed. No pertinent family history. Social History  Substance Use Topics  . Smoking status: Never Smoker   . Smokeless tobacco: None  . Alcohol Use: No   OB History    Gravida Para Term Preterm AB TAB SAB Ectopic Multiple Living   1              Review of Systems  All other systems reviewed and are negative.     Allergies  Review of patient's allergies indicates no known allergies.  Home Medications   Prior to Admission medications   Medication Sig Start Date End Date Taking? Authorizing Provider  ferrous sulfate 325 (65 FE) MG tablet Take 325 mg by mouth daily with breakfast.    Historical Provider, MD  loratadine (CLARITIN) 10 MG tablet Take 1 tablet (10 mg total) by mouth daily. 01/12/15 02/12/15  Merrily Pew, MD  naproxen (NAPROSYN) 500 MG tablet Take 1 tablet (500 mg total) by mouth 2 (two) times daily. 01/09/15   Chesley Noon Nadeau, PA-C   BP 112/70 mmHg  Pulse 85  Temp(Src) 98.4 F (36.9 C) (Oral)  Resp 18  Ht 5\' 6"   (1.676 m)  Wt 86.183 kg  BMI 30.68 kg/m2  SpO2 99%  LMP 12/03/2014 Physical Exam  Constitutional: She is oriented to person, place, and time.  HENT:  Right Ear: External ear normal.  Left Ear: External ear normal.  Nose: Nose normal.  Mouth/Throat: Oropharynx is clear and moist. No oropharyngeal exudate.  Eyes: Conjunctivae and EOM are normal. Pupils are equal, round, and reactive to light.  Neck: Normal range of motion. Neck supple.  Cardiovascular: Normal rate, regular rhythm, normal heart sounds and intact distal pulses.   Pulmonary/Chest: Effort normal and breath sounds normal. No respiratory distress. She has no wheezes. She exhibits no tenderness.  Abdominal: Soft. Bowel sounds are normal. She exhibits no distension. There is no tenderness. There is no rebound and no guarding.  Musculoskeletal: She exhibits no edema.  Neurological: She is alert and oriented to person, place, and time. No cranial nerve deficit.  Skin: Skin is warm and dry.  Psychiatric: She has a normal mood and affect.  Nursing note and vitals reviewed.   ED Course  Procedures (including critical care time) Labs Review Labs Reviewed - No data to display  Imaging Review No results found. I have personally reviewed and evaluated these images and lab results as part of my medical decision-making.  EKG Interpretation None      MDM   Final diagnoses:  Viral syndrome  Cough  Nausea    Pt is an 30 y.o. female who is currently [redacted] weeks pregnant with four days of URI symptoms. She is afebrile, not tachycardic, nontoxic appearing. She has a nonfocal exam. I suspect viral etiology to pt's symptoms. Given pregnancy will hold off on CXR today. Will give tylenol here for body aches and chills. Will give rx for claritin, diclegis, and nasal saline spray. Instructed to f/u with PCP or OB/GYN. ER return precautions given.    Anne Ng, Hershal Coria 04/11/15 2128  Leonard Schwartz, MD 04/17/15 [phone]

## 2015-04-11 NOTE — Discharge Instructions (Signed)
Your symptoms are likely due to a virus. Due to your pregnancy we did not do a chest x-ray today. I have low suspicion for bacterial pneumonia. You may take tylenol as needed for body aches and chills. I will give you a few prescriptions to help with your symptoms. Please follow up with your primary care provider within one week. Return to the ER for new or worsening symptoms.

## 2015-04-11 NOTE — ED Notes (Signed)
Pt verbalizes understanding of d/c instructions and denies any further needs at this time. 

## 2015-04-11 NOTE — ED Notes (Signed)
Pt c/o general cold symptoms for the last 4 days not relieved by tylenol

## 2016-12-02 ENCOUNTER — Other Ambulatory Visit: Payer: Self-pay

## 2016-12-02 ENCOUNTER — Encounter (HOSPITAL_BASED_OUTPATIENT_CLINIC_OR_DEPARTMENT_OTHER): Payer: Self-pay

## 2016-12-02 ENCOUNTER — Emergency Department (HOSPITAL_BASED_OUTPATIENT_CLINIC_OR_DEPARTMENT_OTHER): Payer: Self-pay

## 2016-12-02 ENCOUNTER — Emergency Department (HOSPITAL_BASED_OUTPATIENT_CLINIC_OR_DEPARTMENT_OTHER)
Admission: EM | Admit: 2016-12-02 | Discharge: 2016-12-02 | Disposition: A | Discharge status: Home / Self Care | Payer: Self-pay | Attending: Emergency Medicine | Admitting: Emergency Medicine

## 2016-12-02 DIAGNOSIS — R0789 Other chest pain: Secondary | ICD-10-CM | POA: Insufficient documentation

## 2016-12-02 DIAGNOSIS — Z79899 Other long term (current) drug therapy: Secondary | ICD-10-CM | POA: Insufficient documentation

## 2016-12-02 DIAGNOSIS — J45909 Unspecified asthma, uncomplicated: Secondary | ICD-10-CM | POA: Insufficient documentation

## 2016-12-02 MED ORDER — BENZONATATE 100 MG PO CAPS: 100.0000 mg | ORAL_CAPSULE | Freq: Three times a day (TID) | ORAL | 0 refills | Status: DC | PRN

## 2016-12-02 MED ORDER — NAPROXEN 500 MG PO TABS: 500.0000 mg | ORAL_TABLET | Freq: Two times a day (BID) | ORAL | 0 refills | Status: DC

## 2016-12-02 NOTE — ED Triage Notes (Signed)
Pt c/o cough for two months with central chest pain that is occasionally on the left side and occasionally has back pain associated with the pain.  She denies fever, denies recent long trips, is not on birth control, non-smoker as well.

## 2016-12-02 NOTE — ED Provider Notes (Signed)
[facility] Provider Note   CSN: [id number] Arrival date & time: 12/02/16  1939     History   Chief Complaint Chief Complaint  Patient presents with  . Chest Pain    HPI Nancy Schultz is a 31 y.o. female.  The history is provided by the patient and medical records. No language interpreter was used.   Nancy Schultz is a 31 y.o. female  with a PMH of asthma who presents to the Emergency Department complaining of sharp central chest pain and dry cough x 1 month. Patient states at onset of symptoms, she had several cold symptoms. Cold sxs resolved, but cough has been persistent. Not productive. Chest pain feels worse with coughing or deep breathing. No alleviating factors noted. Patient denies trouble breathing. Initially took Mucinex for cold, but no medications in the last week or two. No long travel, recent surgeries/immobilizations, leg swelling, calf pain, hormone use, hx of blood clots. No cardiac history. Notes history of similar in the past when she was diagnosed with pleurisy.   Past Medical History:  Diagnosis Date  . Anemia   . Anemia   . Asthma     There are no active problems to display for this patient.   Past Surgical History:  Procedure Laterality Date  . CESAREAN SECTION      OB History    Gravida Para Term Preterm AB Living   1             SAB TAB Ectopic Multiple Live Births                   Home Medications    Prior to Admission medications   Medication Sig Start Date End Date Taking? Authorizing Provider  benzonatate (TESSALON) 100 MG capsule Take 1 capsule (100 mg total) by mouth 3 (three) times daily as needed for cough. 12/02/16   Shawntavia Saunders, Ozella Almond, PA-C  Doxylamine-Pyridoxine (DICLEGIS) 10-10 MG TBEC Take two tabs at bedtime; if symptoms persist, take one tab in the morning then two tabs at bedtime. 04/11/15   Sam, Olivia Canter, PA-C  ferrous sulfate 325 (65 FE) MG tablet Take 325 mg by mouth daily with breakfast.    [provider]  loratadine (CLARITIN) 10 MG tablet Take 1 tablet (10 mg total) by mouth daily. 01/12/15 02/12/15  Mesner, Corene Cornea, MD  loratadine (CLARITIN) 10 MG tablet Take 1 tablet (10 mg total) by mouth daily as needed for rhinitis. 04/11/15   Sam, Olivia Canter, PA-C  naproxen (NAPROSYN) 500 MG tablet Take 1 tablet (500 mg total) by mouth 2 (two) times daily. 12/02/16   Dylann Gallier, Ozella Almond, PA-C  sodium chloride (OCEAN) 0.65 % SOLN nasal spray Place 2 sprays into both nostrils as needed for congestion. 04/11/15   Anne Ng, PA-C    Family History No family history on file.  Social History Social History  Substance Use Topics  . Smoking status: Never Smoker  . Smokeless tobacco: Never Used  . Alcohol use No     Allergies   Patient has no known allergies.   Review of Systems Review of Systems  Respiratory: Positive for cough. Negative for shortness of breath and wheezing.   Cardiovascular: Positive for chest pain. Negative for palpitations and leg swelling.  All other systems reviewed and are negative.    Physical Exam Updated Vital Signs BP 104/62 (BP Location: Right Arm)   Pulse 76   Temp 98.1 F (36.7 C) (Oral)   Resp 14  Ht 5\' 6"  (1.676 m)   Wt 82.6 kg (182 lb)   LMP 11/18/2016   SpO2 100%   Breastfeeding? Unknown   BMI 29.38 kg/m   Physical Exam  Constitutional: She is oriented to person, place, and time. She appears well-developed and well-nourished. No distress.  HENT:  Head: Normocephalic and atraumatic.  Neck: No JVD present.  Cardiovascular: Normal rate, regular rhythm and normal heart sounds.   No murmur heard. Pulmonary/Chest: Effort normal and breath sounds normal. No respiratory distress. She has no wheezes. She has no rales. She exhibits tenderness.  Abdominal: Soft. She exhibits no distension. There is no tenderness.  Musculoskeletal: She exhibits no edema.  Neurological: She is alert and oriented to person, place, and time.  Skin: Skin is warm  and dry.  Nursing note and vitals reviewed.    ED Treatments / Results  Labs (all labs ordered are listed, but only abnormal results are displayed) Labs Reviewed - No data to display  EKG  EKG Interpretation None       Radiology Dg Chest 2 View  Result Date: 12/02/2016 CLINICAL DATA:  Mid chest pain radiating to upper back, shortness of breath. Symptoms for 1 month. History of asthma. EXAM: CHEST  2 VIEW COMPARISON:  Chest radiograph January 09, 2015 mild bronchitic changes. FINDINGS: Cardiomediastinal silhouette is normal. No pleural effusions or focal consolidations. Trachea projects midline and there is no pneumothorax. Soft tissue planes and included osseous structures are non-suspicious. IMPRESSION: Mild bronchitic changes. Electronically Signed   By: Elon Alas M.D.   On: 12/02/2016 20:19    Procedures Procedures (including critical care time)  Medications Ordered in ED Medications - No data to display   Initial Impression / Assessment and Plan / ED Course  I have reviewed the triage vital signs and the nursing notes.  Pertinent labs & imaging results that were available during my care of the patient were reviewed by me and considered in my medical decision making (see chart for details).    Nancy Schultz is a 31 y.o. female who presents to ED for central chest pain and cough x 1 month. Initially associated with cold symptoms which have resolved. Cough has been persistent. On exam, patient is afebrile, hemodynamically stable with tenderness along chest wall - otherwise normal cardiopulmonary exam. PERC negative. Low risk heart score. Likely chest wall pain. EKG not crossing over but reviewed with attending, Dr. Rex Kras and unchanged from previous. CXR with mild bronchitic changes. Evaluation does not show pathology that would require ongoing emergent intervention or inpatient treatment. Symptomatic home care instructions discussed. PCP follow up strongly  encouraged. Reasons to return to ER discussed. All questions answered.   Patient discussed with Dr. Rex Kras who agrees with treatment plan.    Final Clinical Impressions(s) / ED Diagnoses   Final diagnoses:  Chest wall pain    New Prescriptions Discharge Medication List as of 12/02/2016 11:14 PM    START taking these medications   Details  benzonatate (TESSALON) 100 MG capsule Take 1 capsule (100 mg total) by mouth 3 (three) times daily as needed for cough., Starting Wed 12/02/2016, Print         Docia Klar, Ozella Almond, PA-C 12/03/16 0025    Rex Kras, Wenda Overland, MD 12/03/16 2221

## 2016-12-02 NOTE — Discharge Instructions (Signed)
It was my pleasure taking care of you today!   Tessalon as needed for cough. Take Naproxen twice daily for the next week.   Follow up with your primary care provider if symptoms do not improve. If you do not have a primary care provider, please see the attached resource guide.   Return to ER for new or worsening symptoms, any additional concerns.

## 2016-12-12 IMAGING — US US OB COMP LESS 14 WK
1 series · 14 of 28 positions shown · non-contrast
Comparison: None.

CLINICAL DATA: Recent bleeding, history of positive pregnancy test

EXAM:
OBSTETRIC <14 WK US AND TRANSVAGINAL OB US
TECHNIQUE: Both transabdominal and transvaginal ultrasound examinations were
performed for complete evaluation of the gestation as well as the
maternal uterus, adnexal regions, and pelvic cul-de-sac.
Transvaginal technique was performed to assess early pregnancy.

[Series 1: us ob comp less 14 wk · 0.21mm/px · 14 of 37 slices shown]
[im 2/37]
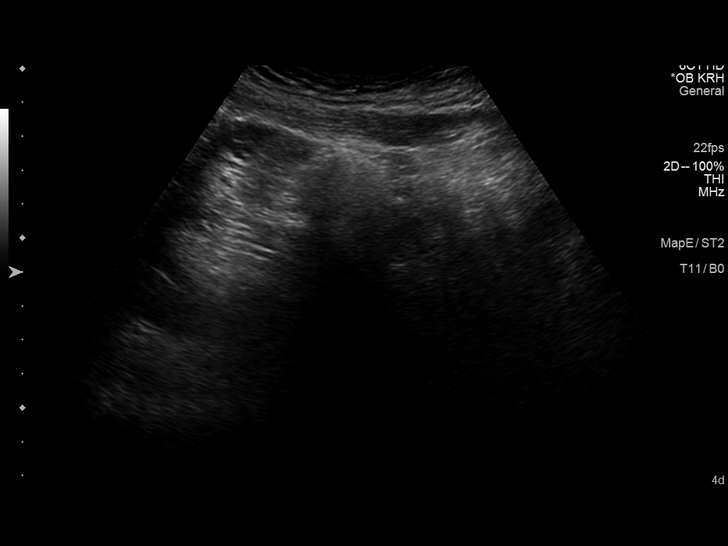
[im 5/37]
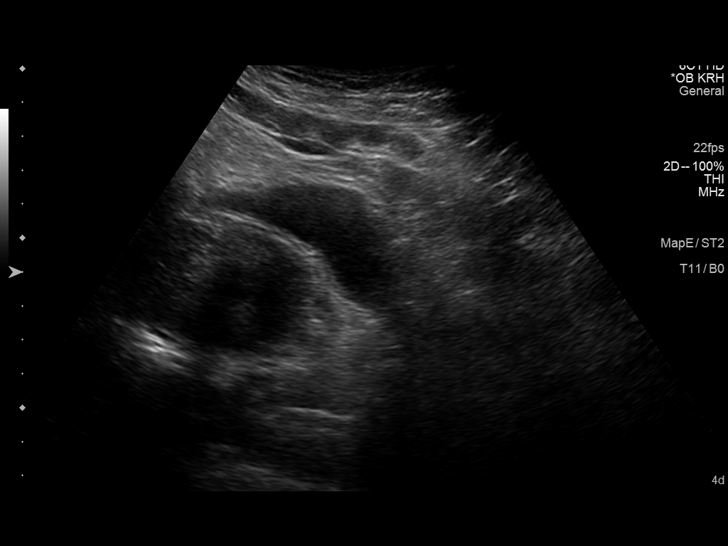
[im 7/37]
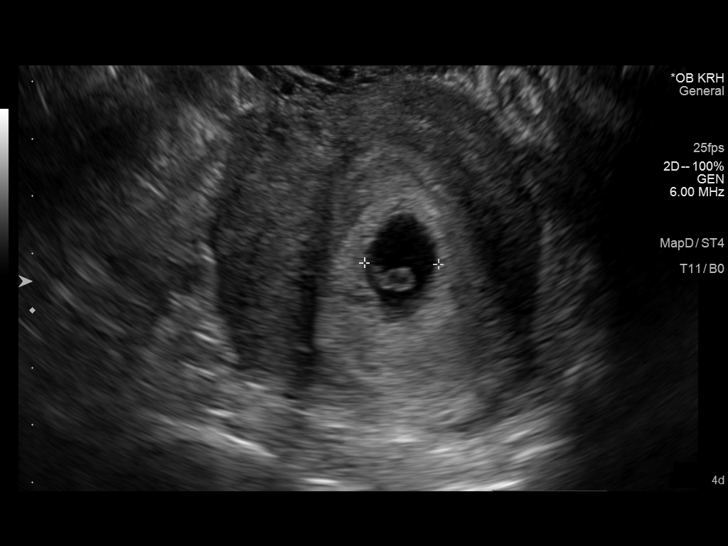
[im 10/37]
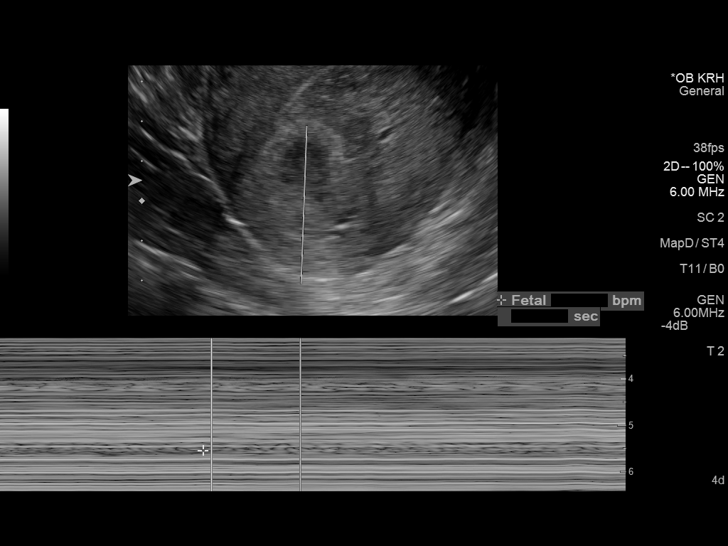
[im 13/37]
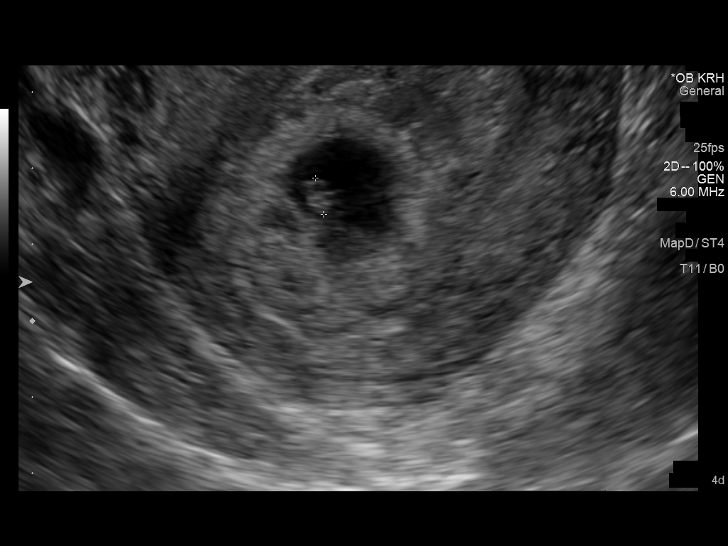
[im 15/37]
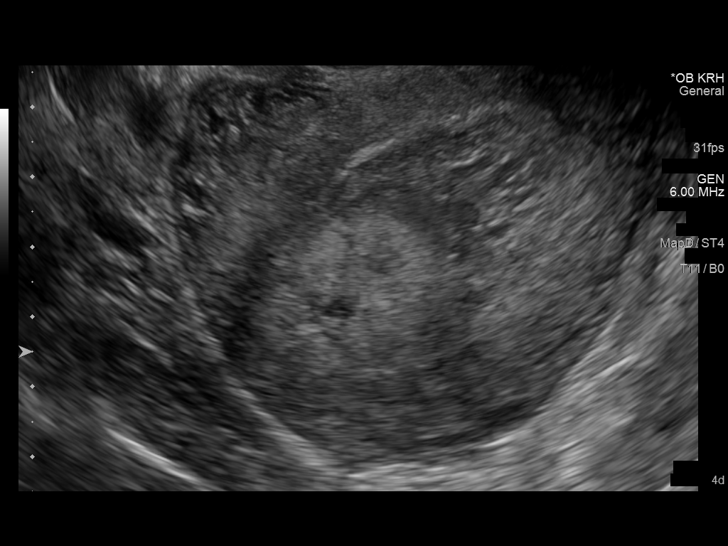
[im 18/37]
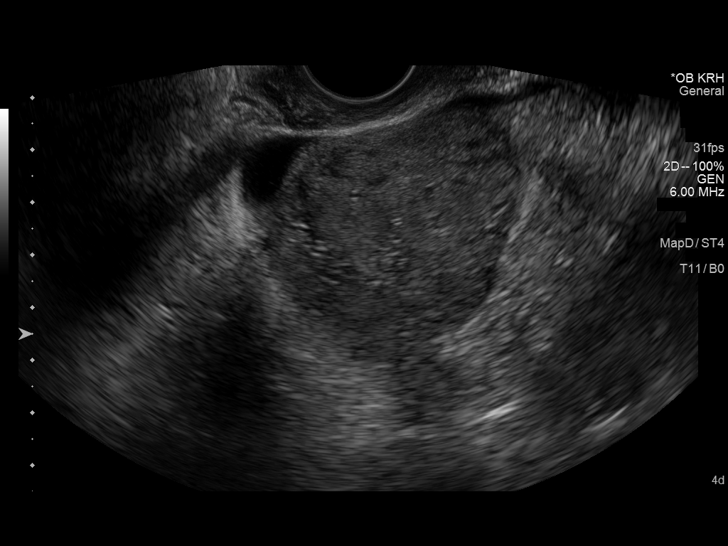
[im 21/37]
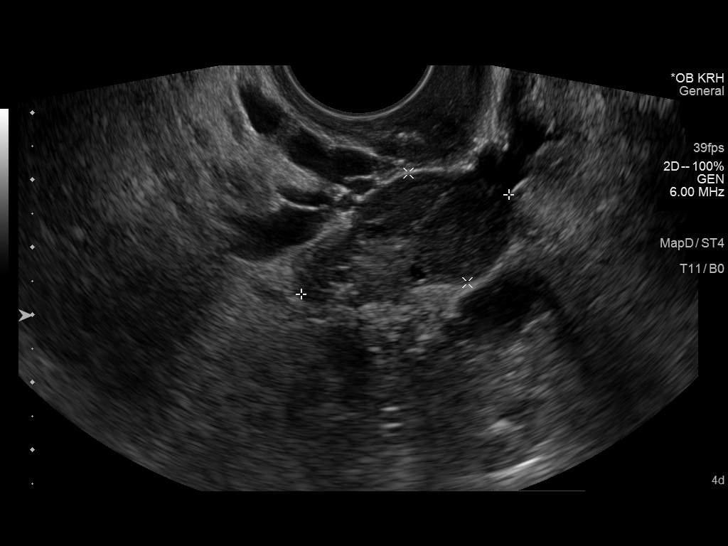
[im 23/37]
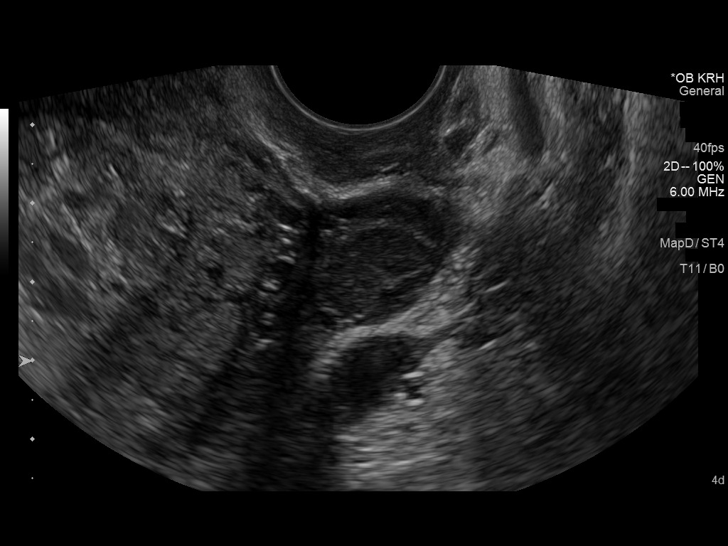
[im 26/37]
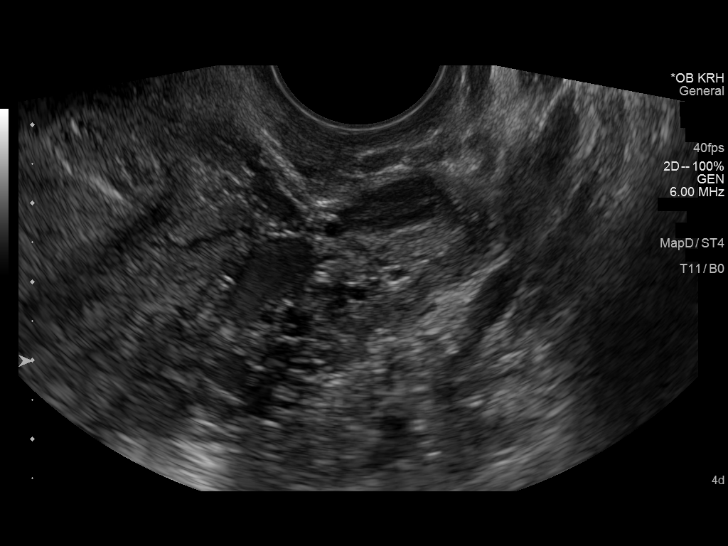
[im 29/37]
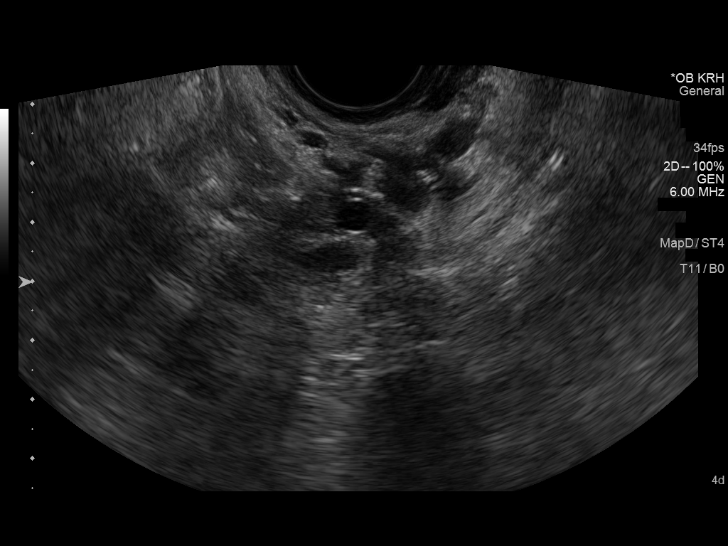
[im 31/37]
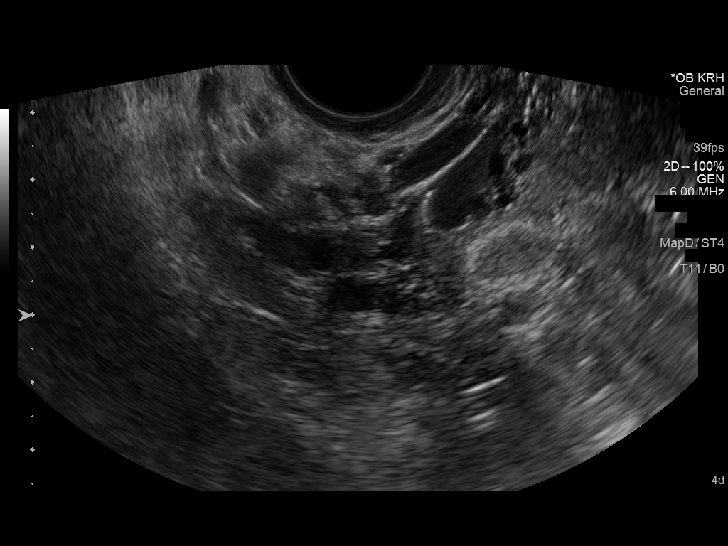
[im 34/37]
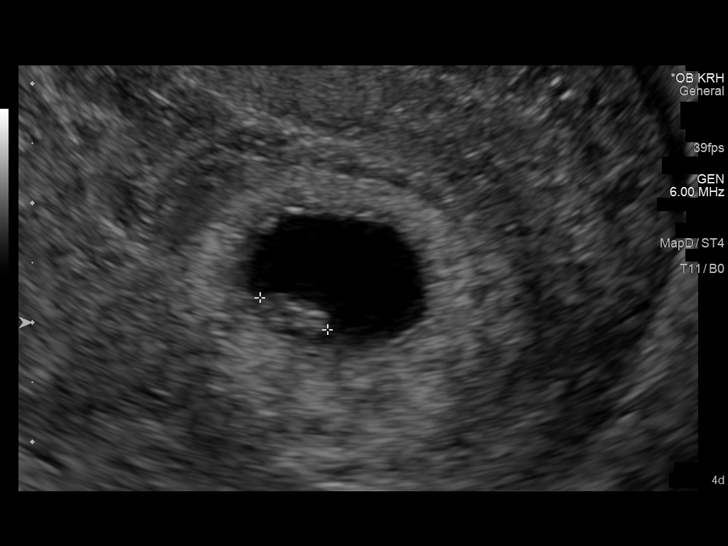
[im 37/37]
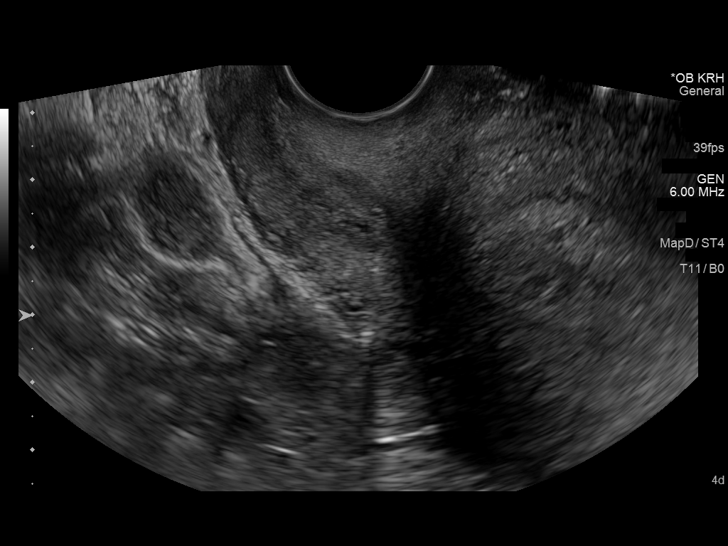

[14 of 28 positions shown; findings below may reference images not displayed]

FINDINGS: Intrauterine gestational sac: Visualized/normal in shape.

Yolk sac:  Present

Embryo:  Present

Cardiac Activity: Present

Heart Rate: 115  bpm

CRL:  5  mm   6 w   2 d                  US EDC: 09/20/2015

Maternal uterus/adnexae: A small subchorionic hemorrhage is noted.
The ovaries are within normal limits. Small amount of free pelvic
fluid is noted.
IMPRESSION: Small subchorionic hemorrhage.

Single live intrauterine gestation at 6 weeks 2 days

## 2017-03-30 HISTORY — PX: DILATION AND CURETTAGE OF UTERUS: SHX78

## 2017-04-06 ENCOUNTER — Emergency Department (HOSPITAL_BASED_OUTPATIENT_CLINIC_OR_DEPARTMENT_OTHER): Payer: No Typology Code available for payment source

## 2017-04-06 ENCOUNTER — Encounter (HOSPITAL_BASED_OUTPATIENT_CLINIC_OR_DEPARTMENT_OTHER): Payer: Self-pay | Admitting: *Deleted

## 2017-04-06 ENCOUNTER — Emergency Department (HOSPITAL_BASED_OUTPATIENT_CLINIC_OR_DEPARTMENT_OTHER)
Admission: EM | Admit: 2017-04-06 | Discharge: 2017-04-06 | Disposition: A | Discharge status: Home / Self Care | Payer: No Typology Code available for payment source | Attending: Emergency Medicine | Admitting: Emergency Medicine

## 2017-04-06 ENCOUNTER — Other Ambulatory Visit: Payer: Self-pay

## 2017-04-06 DIAGNOSIS — Y9389 Activity, other specified: Secondary | ICD-10-CM | POA: Diagnosis not present

## 2017-04-06 DIAGNOSIS — S46912A Strain of unspecified muscle, fascia and tendon at shoulder and upper arm level, left arm, initial encounter: Secondary | ICD-10-CM | POA: Insufficient documentation

## 2017-04-06 DIAGNOSIS — Y92410 Unspecified street and highway as the place of occurrence of the external cause: Secondary | ICD-10-CM | POA: Insufficient documentation

## 2017-04-06 DIAGNOSIS — Y999 Unspecified external cause status: Secondary | ICD-10-CM | POA: Insufficient documentation

## 2017-04-06 DIAGNOSIS — M545 Low back pain, unspecified: Secondary | ICD-10-CM

## 2017-04-06 DIAGNOSIS — T148XXA Other injury of unspecified body region, initial encounter: Secondary | ICD-10-CM

## 2017-04-06 DIAGNOSIS — S4992XA Unspecified injury of left shoulder and upper arm, initial encounter: Secondary | ICD-10-CM | POA: Diagnosis present

## 2017-04-06 DIAGNOSIS — J45909 Unspecified asthma, uncomplicated: Secondary | ICD-10-CM | POA: Insufficient documentation

## 2017-04-06 LAB — PREGNANCY, URINE: Preg Test, Ur: NEGATIVE

## 2017-04-06 MED ORDER — HYDROCODONE-ACETAMINOPHEN 5-325 MG PO TABS
1.0000 | ORAL_TABLET | Freq: Once | ORAL | Status: AC
  Administered 2017-04-06: 1 via ORAL
  Filled 2017-04-06: qty 1

## 2017-04-06 MED ORDER — METHOCARBAMOL 500 MG PO TABS: 500.0000 mg | ORAL_TABLET | Freq: Two times a day (BID) | ORAL | 0 refills | Status: DC
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
## 2017-04-06 NOTE — ED Notes (Signed)
ED Provider at bedside. 
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
## 2017-04-06 NOTE — Discharge Instructions (Signed)

## 2017-09-15 ENCOUNTER — Encounter (HOSPITAL_BASED_OUTPATIENT_CLINIC_OR_DEPARTMENT_OTHER): Payer: Self-pay

## 2017-09-15 ENCOUNTER — Other Ambulatory Visit: Payer: Self-pay

## 2017-09-15 DIAGNOSIS — O9952 Diseases of the respiratory system complicating childbirth: Secondary | ICD-10-CM | POA: Insufficient documentation

## 2017-09-15 DIAGNOSIS — Z79899 Other long term (current) drug therapy: Secondary | ICD-10-CM | POA: Insufficient documentation

## 2017-09-15 DIAGNOSIS — R1011 Right upper quadrant pain: Secondary | ICD-10-CM | POA: Insufficient documentation

## 2017-09-15 DIAGNOSIS — O9989 Other specified diseases and conditions complicating pregnancy, childbirth and the puerperium: Secondary | ICD-10-CM | POA: Insufficient documentation

## 2017-09-15 DIAGNOSIS — Z3A11 11 weeks gestation of pregnancy: Secondary | ICD-10-CM | POA: Insufficient documentation

## 2017-09-15 DIAGNOSIS — J45909 Unspecified asthma, uncomplicated: Secondary | ICD-10-CM | POA: Insufficient documentation

## 2017-09-15 LAB — URINALYSIS, ROUTINE W REFLEX MICROSCOPIC
Bilirubin Urine: NEGATIVE
Glucose, UA: NEGATIVE mg/dL
Hgb urine dipstick: NEGATIVE
Ketones, ur: 15 mg/dL — AB
NITRITE: NEGATIVE
Protein, ur: NEGATIVE mg/dL
pH: 6 (ref 5.0–8.0)

## 2017-09-15 LAB — URINALYSIS, MICROSCOPIC (REFLEX)

## 2017-09-15 LAB — PREGNANCY, URINE: PREG TEST UR: POSITIVE — AB

## 2017-09-15 NOTE — ED Triage Notes (Signed)
Right side abdominal cramping with nausea and sweating, pt is [redacted] weeks pregnant, denies vaginal bleeding, states she has not been able to urinate despite drinking a lot of fluid today

## 2017-09-16 ENCOUNTER — Emergency Department (HOSPITAL_BASED_OUTPATIENT_CLINIC_OR_DEPARTMENT_OTHER)
Admission: EM | Admit: 2017-09-16 | Discharge: 2017-09-16 | Disposition: A | Discharge status: Home / Self Care | Payer: Self-pay | Attending: Emergency Medicine | Admitting: Emergency Medicine

## 2017-09-16 ENCOUNTER — Encounter (HOSPITAL_BASED_OUTPATIENT_CLINIC_OR_DEPARTMENT_OTHER): Payer: Self-pay

## 2017-09-16 ENCOUNTER — Encounter (INDEPENDENT_AMBULATORY_CARE_PROVIDER_SITE_OTHER): Payer: Self-pay

## 2017-09-16 ENCOUNTER — Ambulatory Visit (HOSPITAL_BASED_OUTPATIENT_CLINIC_OR_DEPARTMENT_OTHER): Payer: Self-pay

## 2017-09-16 ENCOUNTER — Ambulatory Visit (HOSPITAL_BASED_OUTPATIENT_CLINIC_OR_DEPARTMENT_OTHER)
Admit: 2017-09-16 | Discharge: 2017-09-16 | Disposition: A | Discharge status: Home / Self Care | Payer: Self-pay | Attending: Emergency Medicine | Admitting: Emergency Medicine

## 2017-09-16 DIAGNOSIS — O3411 Maternal care for benign tumor of corpus uteri, first trimester: Secondary | ICD-10-CM | POA: Insufficient documentation

## 2017-09-16 DIAGNOSIS — O209 Hemorrhage in early pregnancy, unspecified: Secondary | ICD-10-CM | POA: Insufficient documentation

## 2017-09-16 DIAGNOSIS — O26891 Other specified pregnancy related conditions, first trimester: Secondary | ICD-10-CM | POA: Insufficient documentation

## 2017-09-16 DIAGNOSIS — Z3A08 8 weeks gestation of pregnancy: Secondary | ICD-10-CM | POA: Insufficient documentation

## 2017-09-16 DIAGNOSIS — R1011 Right upper quadrant pain: Secondary | ICD-10-CM

## 2017-09-16 DIAGNOSIS — Z3A11 11 weeks gestation of pregnancy: Secondary | ICD-10-CM

## 2017-09-16 DIAGNOSIS — D259 Leiomyoma of uterus, unspecified: Secondary | ICD-10-CM | POA: Insufficient documentation

## 2017-09-16 HISTORY — DX: Other specified disorders of amniotic fluid and membranes, unspecified trimester, not applicable or unspecified: O41.8X90

## 2017-09-16 HISTORY — DX: Other antepartum hemorrhage, unspecified trimester: O46.8X9

## 2017-09-16 HISTORY — DX: Supervision of pregnancy with other poor reproductive or obstetric history, unspecified trimester: O09.299

## 2017-09-16 LAB — COMPREHENSIVE METABOLIC PANEL
ALBUMIN: 3.4 g/dL — AB (ref 3.5–5.0)
ALT: 12 U/L — ABNORMAL LOW (ref 14–54)
AST: 15 U/L (ref 15–41)
Alkaline Phosphatase: 46 U/L (ref 38–126)
Anion gap: 8 (ref 5–15)
BUN: 11 mg/dL (ref 6–20)
CHLORIDE: 108 mmol/L (ref 101–111)
CO2: 22 mmol/L (ref 22–32)
Calcium: 8.9 mg/dL (ref 8.9–10.3)
Creatinine, Ser: 0.58 mg/dL (ref 0.44–1.00)
GFR calc Af Amer: >60 mL/min (ref >60)
Glucose, Bld: 91 mg/dL (ref 65–99)
POTASSIUM: 3.8 mmol/L (ref 3.5–5.1)
SODIUM: 138 mmol/L (ref 135–145)
Total Bilirubin: 0.1 mg/dL — ABNORMAL LOW (ref 0.3–1.2)
Total Protein: 7.5 g/dL (ref 6.5–8.1)

## 2017-09-16 LAB — CBC WITH DIFFERENTIAL/PLATELET
Basophils Absolute: 0 10*3/uL (ref 0.0–0.1)
Basophils Relative: 0 %
Eosinophils Absolute: 0.1 10*3/uL (ref 0.0–0.7)
Eosinophils Relative: 1 %
HEMATOCRIT: 30.3 % — AB (ref 36.0–46.0)
HEMOGLOBIN: 9.9 g/dL — AB (ref 12.0–15.0)
LYMPHS ABS: 2.5 10*3/uL (ref 0.7–4.0)
LYMPHS PCT: 30 %
MCH: 24.1 pg — AB (ref 26.0–34.0)
MCHC: 32.7 g/dL (ref 30.0–36.0)
MCV: 73.9 fL — ABNORMAL LOW (ref 78.0–100.0)
MONOS PCT: 7 %
Monocytes Absolute: 0.5 10*3/uL (ref 0.1–1.0)
NEUTROS ABS: 5.2 10*3/uL (ref 1.7–7.7)
NEUTROS PCT: 62 %
Platelets: 277 10*3/uL (ref 150–400)
RBC: 4.1 MIL/uL (ref 3.87–5.11)
RDW: 17.9 % — ABNORMAL HIGH (ref 11.5–15.5)
WBC: 8.3 10*3/uL (ref 4.0–10.5)

## 2017-09-16 LAB — HCG, QUANTITATIVE, PREGNANCY: hCG, Beta Chain, Quant, S: 13370 m[IU]/mL — ABNORMAL HIGH (ref <5)

## 2017-09-16 LAB — LIPASE, BLOOD: Lipase: 29 U/L (ref 11–51)

## 2017-09-16 NOTE — ED Notes (Signed)
Patient c/o right-sided abdominal pain and cramping X 2 days accompanied by nausea.

## 2017-09-16 NOTE — ED Provider Notes (Signed)
[facility] EMERGENCY DEPARTMENT Provider Note   CSN: [id number] Arrival date & time: 09/15/17  2311     History   Chief Complaint Chief Complaint  Patient presents with  . Abdominal Pain    HPI Nancy Schultz is a 33 y.o. female.  This patient is a 32 year old female G3 P2-0-0-2 at approximately [redacted] weeks gestation by last menstrual period.  She presents today for evaluation of right-sided abdominal pain.  She has had several days worth of pain to the right upper quadrant that is worse when she eats.  She denies experiencing any vaginal bleeding, vaginal discharge.  She denies any urinary complaints.  She has not yet had an ultrasound and has not yet seen OB.  The history is provided by the patient.  Abdominal Pain   This is a new problem. The current episode started 2 days ago. Episode frequency: Intermittently. The problem has been gradually worsening. The pain is associated with an unknown factor. The pain is located in the RUQ. The quality of the pain is cramping. The pain is moderate. Pertinent negatives include flatus, hematochezia, constipation and frequency. The symptoms are aggravated by eating. Nothing relieves the symptoms.    Past Medical History:  Diagnosis Date  . Anemia   . Anemia   . Asthma     There are no active problems to display for this patient.   Past Surgical History:  Procedure Laterality Date  . CESAREAN SECTION       OB History    Gravida  2   Para      Term      Preterm      AB      Living        SAB      TAB      Ectopic      Multiple      Live Births               Home Medications    Prior to Admission medications   Medication Sig Start Date End Date Taking? Authorizing Provider  ferrous sulfate 325 (65 FE) MG tablet Take 325 mg by mouth daily with breakfast.   Yes [provider]  benzonatate (TESSALON) 100 MG capsule Take 1 capsule (100 mg total) by mouth 3 (three) times daily as needed  for cough. 12/02/16   Ward, Ozella Almond, PA-C  loratadine (CLARITIN) 10 MG tablet Take 1 tablet (10 mg total) by mouth daily. 01/12/15 02/12/15  Mesner, Corene Cornea, MD  loratadine (CLARITIN) 10 MG tablet Take 1 tablet (10 mg total) by mouth daily as needed for rhinitis. 04/11/15   Sam, Olivia Canter, PA-C  methocarbamol (ROBAXIN) 500 MG tablet Take 1 tablet (500 mg total) by mouth 2 (two) times daily. 04/06/17   Volanda Napoleon, PA-C  naproxen (NAPROSYN) 500 MG tablet Take 1 tablet (500 mg total) by mouth 2 (two) times daily. 12/02/16   Ward, Ozella Almond, PA-C  sodium chloride (OCEAN) 0.65 % SOLN nasal spray Place 2 sprays into both nostrils as needed for congestion. 04/11/15   Anne Ng, PA-C    Family History No family history on file.  Social History Social History   Tobacco Use  . Smoking status: Never Smoker  . Smokeless tobacco: Never Used  Substance Use Topics  . Alcohol use: No  . Drug use: No     Allergies   Patient has no known allergies.   Review of Systems Review of Systems  Gastrointestinal:  Positive for abdominal pain. Negative for constipation, flatus and hematochezia.  Genitourinary: Negative for frequency.  All other systems reviewed and are negative.    Physical Exam Updated Vital Signs BP 116/72   Pulse 89   Temp 98.3 F (36.8 C) (Oral)   Resp 18   Ht 5\' 7"  (1.702 m)   Wt 81.6 kg (180 lb)   LMP 06/28/2017   SpO2 99%   BMI 28.19 kg/m   Physical Exam  Constitutional: She is oriented to person, place, and time. She appears well-developed and well-nourished. No distress.  HENT:  Head: Normocephalic and atraumatic.  Neck: Normal range of motion. Neck supple.  Cardiovascular: Normal rate and regular rhythm. Exam reveals no gallop and no friction rub.  No murmur heard. Pulmonary/Chest: Effort normal and breath sounds normal. No respiratory distress. She has no wheezes.  Abdominal: Soft. Bowel sounds are normal. She exhibits no distension. There is  tenderness in the right upper quadrant. There is no rigidity, no rebound and no guarding.  Musculoskeletal: Normal range of motion.  Neurological: She is alert and oriented to person, place, and time.  Skin: Skin is warm and dry. She is not diaphoretic.  Nursing note and vitals reviewed.    ED Treatments / Results  Labs (all labs ordered are listed, but only abnormal results are displayed) Labs Reviewed  URINALYSIS, ROUTINE W REFLEX MICROSCOPIC - Abnormal; Notable for the following components:      Result Value   APPearance CLOUDY (*)    Specific Gravity, Urine >1.030 (*)    Ketones, ur 15 (*)    Leukocytes, UA SMALL (*)    All other components within normal limits  PREGNANCY, URINE - Abnormal; Notable for the following components:   Preg Test, Ur POSITIVE (*)    All other components within normal limits  URINALYSIS, MICROSCOPIC (REFLEX) - Abnormal; Notable for the following components:   Bacteria, UA RARE (*)    Trichomonas, UA PRESENT (*)    All other components within normal limits  COMPREHENSIVE METABOLIC PANEL  LIPASE, BLOOD  CBC WITH DIFFERENTIAL/PLATELET  HCG, QUANTITATIVE, PREGNANCY    EKG None  Radiology No results found.  Procedures Procedures (including critical care time)  Medications Ordered in ED Medications - No data to display   Initial Impression / Assessment and Plan / ED Course  I have reviewed the triage vital signs and the nursing notes.  Pertinent labs & imaging results that were available during my care of the patient were reviewed by me and considered in my medical decision making (see chart for details).  Patient presents with right-sided abdominal pain.  She is [redacted] weeks pregnant, but has not seen OB and has not had an ultrasound.  Her LFTs are unremarkable, but she is tender in the right upper quadrant.  She will return later today for an ultrasound to evaluate her gallbladder.  She will also return for an ultrasound to evaluate the status  of the pregnancy and to rule out an ectopic.  Final Clinical Impressions(s) / ED Diagnoses   Final diagnoses:  None    ED Discharge Orders    None       Veryl Speak, MD 09/16/17 [phone]

## 2017-09-16 NOTE — Discharge Instructions (Addendum)
Return in time for an ultrasound of your gallbladder and pelvis.  Follow-up with your OB/GYN in the next week.

## 2017-09-16 NOTE — ED Notes (Signed)
ED Provider at bedside. 

## 2017-09-16 NOTE — ED Provider Notes (Signed)
Patient presented to the radiology department today for RUQ and pelvic ultrasound as an extension of their workup for abdominal pain on 6/19. Please see previous provider's note for details of that visit to include history, physical and medical decision making.   Right upper quadrant abdominal ultrasound was normal. Pelvic ultrasound showed gestational sac however no fetal heart activity with a fetus at approximately [redacted] weeks gestational age.  I discussed with on-call obstetrics and they said to have her follow-up with her gynecologist with the 3 options of expectant management, medical management, or D&C.  I discussed this with the patient and does have a gynecologist she can follow-up with however it sounds like she will probably go that expectant management route.  She is still been informed them of what is going on to get an appointment.  Return precautions provided.  Also discussed with them reasons to follow up at the emergency department otherwise continue following up with her primary doctor as directed by previous provider.    Merrily Pew, MD 09/16/17 1122

## 2017-09-17 ENCOUNTER — Encounter: Payer: Self-pay | Admitting: Medical

## 2017-09-17 ENCOUNTER — Ambulatory Visit (INDEPENDENT_AMBULATORY_CARE_PROVIDER_SITE_OTHER): Payer: Self-pay | Admitting: Medical

## 2017-09-17 VITALS — BP 119/65 | HR 98 | Ht 67.0 in | Wt 231.1 lb

## 2017-09-17 DIAGNOSIS — O021 Missed abortion: Secondary | ICD-10-CM

## 2017-09-17 MED ORDER — PROMETHAZINE HCL 12.5 MG PO TABS: 12.5000 mg | ORAL_TABLET | Freq: Four times a day (QID) | ORAL | 0 refills | Status: DC | PRN

## 2017-09-17 MED ORDER — MISOPROSTOL 200 MCG PO TABS: ORAL_TABLET | ORAL | 1 refills | Status: DC

## 2017-09-17 MED ORDER — IBUPROFEN 600 MG PO TABS: 600.0000 mg | ORAL_TABLET | Freq: Four times a day (QID) | ORAL | 0 refills | Status: DC | PRN

## 2017-09-17 MED ORDER — OXYCODONE-ACETAMINOPHEN 5-325 MG PO TABS: 1.0000 | ORAL_TABLET | ORAL | 0 refills | Status: DC | PRN

## 2017-09-17 NOTE — Progress Notes (Signed)
History:  [name] is a 32 y.o. G3P2002 who presents to clinic today for failed pregnancy @ ~8w gestation. Ultrasound Imaging on 09-16-17 revealed no fetal heat rate and an irregular decidual sac within the uterus in addition to a subchorionic hemorrhage.  Pt. Attest that she has not  Had any leaking of fluids or any unusually or heavy bleeding from her vagina. She states that she has had some night sweats for the last three days that's been primarily confined to her neck area. She has taken her temperature at home and has not had a temperature ">than 98's " on the thermometer" and a couple times. In the high 96* F. She denies any recent illness, swollen glands,  flank pain, pelvic pain, hematuria, melena.   The following portions of the patient's history were reviewed and updated as appropriate: allergies, current medications, family history, past medical history, social history, past surgical history and problem list.  Review of Systems:  Review of Systems  Constitutional: Positive for chills (pt. stated that she has a couple of little shivers, but denies elevaated temps and denies profuse diaphoresis). Negative for fever, malaise/fatigue and weight loss.  HENT: Negative for congestion, ear pain, hearing loss, sinus pain and sore throat.   Eyes: Negative for blurred vision, double vision and photophobia.  Respiratory: Negative for cough and shortness of breath.   Cardiovascular: Negative for chest pain, palpitations and orthopnea.  Gastrointestinal: Positive for abdominal pain (pt. reported some occasional cramping, but intermittent and pain less than her normal menstral cramps) and nausea. Negative for blood in stool, constipation, diarrhea, heartburn and vomiting.  Genitourinary: Negative for dysuria, flank pain, hematuria and urgency.  Musculoskeletal: Negative for myalgias.  Neurological: Negative for dizziness and headaches.       Objective:  Physical Exam BP 119/65   Pulse  98   Ht 5\' 7"  (1.702 m)   Wt 231 lb 1.6 oz (104.8 kg)   LMP 06/28/2017   BMI 36.20 kg/m  Physical Exam  Constitutional: She is oriented to person, place, and time. She appears well-developed and well-nourished.  HENT:  Head: Normocephalic.  Eyes: Pupils are equal, round, and reactive to light. EOM are normal.  Neck: Normal range of motion. Neck supple. No tracheal deviation present. No thyromegaly present.  Cardiovascular: Normal rate, regular rhythm and normal heart sounds.  Pulmonary/Chest: Effort normal and breath sounds normal. No respiratory distress.  Abdominal: Soft. She exhibits no distension and no mass. There is tenderness (right lower perimumbilical pain elicted on deep palation). There is no rebound and no guarding.  Bowel sounds hypoactive in all four quads.  Musculoskeletal: Normal range of motion.  Lymphadenopathy:    She has no cervical adenopathy.  Neurological: She is alert and oriented to person, place, and time.  Skin: Skin is warm and dry. She is not diaphoretic.  Psychiatric: Her behavior is normal. Thought content normal.     Labs and Imaging No results found for this or any previous visit (from the past 24 hour(s)).  US Ob Less Than 14 Weeks With Ob Transvaginal  Result Date: 09/16/2017 CLINICAL DATA:  Pelvic pain EXAM: OBSTETRIC <14 WK [place] AND TRANSVAGINAL OB US TECHNIQUE: Both transabdominal and transvaginal ultrasound examinations were performed for complete evaluation of the gestation as well as the maternal uterus, adnexal regions, and pelvic cul-de-sac. Transvaginal technique was performed to assess early pregnancy. COMPARISON:  None. FINDINGS: Intrauterine gestational sac: There is an irregular decidual sac within the uterus. Yolk sac: Not visualized. Embryo:  Visualized Cardiac Activity: Not visualized CRL:  16 mm   8 w   0 d Subchorionic hemorrhage: There is a subchorionic hemorrhage measuring 2.0 x 0.5 cm. Maternal uterus/adnexae: There is a hypoechoic  mass consistent with a leiomyoma in the anterior uterine fundus toward the right measuring 2.7 x 2.4 x 3.0 cm. Cervical os closed. Right ovary measures 2.8 x 1.1 x 1.0 cm. Left ovary measures 2.5 x 1.5 x 1.8 cm. No extrauterine pelvic or adnexal mass. No appreciable free fluid. IMPRESSION: 1. Intrauterine gestation seen within an irregular gestational sac. No fetal heart activity identified. Findings meet definitive criteria for failed pregnancy. This follows SRU consensus guidelines: Diagnostic Criteria for Nonviable Pregnancy Early in the First Trimester. Alison Stalling J Med [phone]. 2.  Small subchorionic hemorrhage. 3. 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 4.  No extrauterine pelvic lesion or free fluid. Electronically Signed   By: Lowella Grip III M.D.   On: 09/16/2017 10:36   US Abdomen Limited Ruq/gall Bladder  Result Date: 09/16/2017 CLINICAL DATA:  Upper abdominal pain EXAM: ULTRASOUND ABDOMEN LIMITED RIGHT UPPER QUADRANT COMPARISON:  None. FINDINGS: Gallbladder: No gallstones or wall thickening visualized. There is no pericholecystic fluid. No sonographic Murphy sign noted by sonographer. Common bile duct: Diameter: 3 mm. No intrahepatic or extrahepatic biliary duct dilatation. Liver: No focal lesion identified. Within normal limits in parenchymal echogenicity. Portal vein is patent on color Doppler imaging with normal direction of blood flow towards the liver. IMPRESSION: Study within normal limits. Electronically Signed   By: Lowella Grip III M.D.   On: 09/16/2017 10:31     Assessment & Plan:  Spontaneous abortion Missed abortion Plan Discussed with pt. options for TAB to include Cytotec and D/C surgery. Pt. verbalized wanting to avoid surgery if possible and is open to medication.  Follow up in two weeks  Shanera Meske M, RN, FNP (student) 09/17/2017 9:47 AM

## 2017-09-17 NOTE — Progress Notes (Signed)
Has not had any bleeding.

## 2017-09-17 NOTE — Patient Instructions (Signed)

## 2017-09-17 NOTE — Progress Notes (Signed)
History:  [name] is a 31 y.o. G3P2002 who presents to clinic today for ED follow-up for missed AB at [redacted] weeks GA diagnosed yesterday. The patient had presented with right sided pain. She denies vaginal bleeding or fevers. She has had some nausea without vomiting. She states +UPT in April and no additional follow-up since then for this pregnancy until yesterday.    The following portions of the patient's history were reviewed and updated as appropriate: allergies, current medications, family history, past medical history, social history, past surgical history and problem list.  Review of Systems:  Review of Systems  Constitutional: Negative for chills and fever.  Gastrointestinal: Positive for abdominal pain.  Genitourinary:       Neg - vaginal bleeding      Objective:  Physical Exam BP 119/65   Pulse 98   Ht '5\' 7"'  (1.702 m)   Wt 231 lb 1.6 oz (104.8 kg)   LMP 06/28/2017   BMI 36.20 kg/m  Physical Exam  Constitutional: She is oriented to person, place, and time. She appears well-developed and well-nourished. No distress.  Cardiovascular: Normal rate.  Pulmonary/Chest: Effort normal.  Abdominal: Soft.  Neurological: She is alert and oriented to person, place, and time.  Skin: Skin is warm and dry. No erythema.  Psychiatric: She has a normal mood and affect. Her behavior is normal. Thought content normal.  Nursing note and vitals reviewed.   Labs and Imaging Results for orders placed or performed during the hospital encounter of 09/16/17 (from the past 48 hour(s))  Urinalysis, Routine w reflex microscopic     Status: Abnormal   Collection Time: 09/15/17 11:24 PM  Result Value Ref Range   Color, Urine YELLOW YELLOW   APPearance CLOUDY (A) CLEAR   Specific Gravity, Urine >1.030 (H) 1.005 - 1.030   pH 6.0 5.0 - 8.0   Glucose, UA NEGATIVE NEGATIVE mg/dL   Hgb urine dipstick NEGATIVE NEGATIVE   Bilirubin Urine NEGATIVE NEGATIVE   Ketones, ur 15 (A) NEGATIVE mg/dL    Protein, ur NEGATIVE NEGATIVE mg/dL   Nitrite NEGATIVE NEGATIVE   Leukocytes, UA SMALL (A) NEGATIVE    Comment: Performed at [facility], [place], [place] [address]  Pregnancy, urine     Status: Abnormal   Collection Time: 09/15/17 11:24 PM  Result Value Ref Range   Preg Test, Ur POSITIVE (A) NEGATIVE    Comment:        THE SENSITIVITY OF THIS METHODOLOGY IS >20 mIU/mL. Performed at [facility], [address] [place], [place] [address]   Urinalysis, Microscopic (reflex)     Status: Abnormal   Collection Time: 09/15/17 11:24 PM  Result Value Ref Range   RBC / HPF 0-5 0 - 5 RBC/hpf   WBC, UA 11-20 0 - 5 WBC/hpf   Bacteria, UA RARE (A) NONE SEEN   Squamous Epithelial / LPF 6-10 0 - 5   Trichomonas, UA PRESENT (A) NONE SEEN    Comment: Performed at [facility], [address] [place], [place] [address]  Comprehensive metabolic panel     Status: Abnormal   Collection Time: 09/16/17  2:35 AM  Result Value Ref Range   Sodium 138 135 - 145 mmol/L   Potassium 3.8 3.5 - 5.1 mmol/L   Chloride 108 101 - 111 mmol/L   CO2 22 22 - 32 mmol/L   Glucose, Bld 91 65 - 99 mg/dL   BUN 11  6 - 20 mg/dL   Creatinine, Ser 0.58 0.44 - 1.00 mg/dL   Calcium 8.9 8.9 - 10.3 mg/dL   Total Protein 7.5 6.5 - 8.1 g/dL   Albumin 3.4 (L) 3.5 - 5.0 g/dL   AST 15 15 - 41 U/L   ALT 12 (L) 14 - 54 U/L   Alkaline Phosphatase 46 38 - 126 U/L   Total Bilirubin 0.1 (L) 0.3 - 1.2 mg/dL   GFR calc non Af Amer >60 >60 mL/min   GFR calc Af Amer >60 >60 mL/min    Comment: (NOTE) The eGFR has been calculated using the CKD EPI equation. This calculation has not been validated in all clinical situations. eGFR's persistently <60 mL/min signify possible Chronic Kidney Disease.    Anion gap 8 5 - 15    Comment: Performed at [facility], [address] [place], [place] [address]  Lipase, blood     Status: None   Collection Time: 09/16/17  2:35 AM   Result Value Ref Range   Lipase 29 11 - 51 U/L    Comment: Performed at [facility], [address] [place], [place] [address]  CBC with Differential     Status: Abnormal   Collection Time: 09/16/17  2:35 AM  Result Value Ref Range   WBC 8.3 4.0 - 10.5 K/uL   RBC 4.10 3.87 - 5.11 MIL/uL   Hemoglobin 9.9 (L) 12.0 - 15.0 g/dL   HCT 30.3 (L) 36.0 - 46.0 %   MCV 73.9 (L) 78.0 - 100.0 fL   MCH 24.1 (L) 26.0 - 34.0 pg   MCHC 32.7 30.0 - 36.0 g/dL   RDW 17.9 (H) 11.5 - 15.5 %   Platelets 277 150 - 400 K/uL   Neutrophils Relative % 62 %   Neutro Abs 5.2 1.7 - 7.7 K/uL   Lymphocytes Relative 30 %   Lymphs Abs 2.5 0.7 - 4.0 K/uL   Monocytes Relative 7 %   Monocytes Absolute 0.5 0.1 - 1.0 K/uL   Eosinophils Relative 1 %   Eosinophils Absolute 0.1 0.0 - 0.7 K/uL   Basophils Relative 0 %   Basophils Absolute 0.0 0.0 - 0.1 K/uL    Comment: Performed at [facility], [address] [place], [place] [address]  hCG, quantitative, pregnancy     Status: Abnormal   Collection Time: 09/16/17  2:35 AM  Result Value Ref Range   hCG, Beta Chain, Quant, S 13,370 (H) <5 mIU/mL    Comment:          GEST. AGE      CONC.  (mIU/mL)   <=1 WEEK        5 - 50     2 WEEKS       50 - 500     3 WEEKS       100 - 10,000     4 WEEKS     1,000 - 30,000     5 WEEKS     3,500 - 115,000   6-8 WEEKS     12,000 - 270,000    12 WEEKS     15,000 - 220,000        FEMALE AND NON-PREGNANT FEMALE:     LESS THAN 5 mIU/mL Performed at [facility], [address] [place], [place] [address]      US Ob Less Than 14 Weeks With Ob Transvaginal  Result Date: 09/16/2017 CLINICAL  DATA:  Pelvic pain EXAM: OBSTETRIC <14 WK [place] AND TRANSVAGINAL OB US TECHNIQUE: Both transabdominal and transvaginal ultrasound examinations were performed for complete evaluation of the gestation as well as the maternal uterus, adnexal regions, and pelvic cul-de-sac. Transvaginal technique was performed to assess  early pregnancy. COMPARISON:  None. FINDINGS: Intrauterine gestational sac: There is an irregular decidual sac within the uterus. Yolk sac: Not visualized. Embryo:  Visualized Cardiac Activity: Not visualized CRL:  16 mm   8 w   0 d Subchorionic hemorrhage: There is a subchorionic hemorrhage measuring 2.0 x 0.5 cm. Maternal uterus/adnexae: There is a hypoechoic mass consistent with a leiomyoma in the anterior uterine fundus toward the right measuring 2.7 x 2.4 x 3.0 cm. Cervical os closed. Right ovary measures 2.8 x 1.1 x 1.0 cm. Left ovary measures 2.5 x 1.5 x 1.8 cm. No extrauterine pelvic or adnexal mass. No appreciable free fluid. IMPRESSION: 1. Intrauterine gestation seen within an irregular gestational sac. No fetal heart activity identified. Findings meet definitive criteria for failed pregnancy. This follows SRU consensus guidelines: Diagnostic Criteria for Nonviable Pregnancy Early in the First Trimester. Alison Stalling J Med [id number]. 2.  Small subchorionic hemorrhage. 3. 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 4.  No extrauterine pelvic lesion or free fluid. Electronically Signed   By: Lowella Grip III M.D.   On: 09/16/2017 10:36   US Abdomen Limited Ruq/gall Bladder  Result Date: 09/16/2017 CLINICAL DATA:  Upper abdominal pain EXAM: ULTRASOUND ABDOMEN LIMITED RIGHT UPPER QUADRANT COMPARISON:  None. FINDINGS: Gallbladder: No gallstones or wall thickening visualized. There is no pericholecystic fluid. No sonographic Murphy sign noted by sonographer. Common bile duct: Diameter: 3 mm. No intrahepatic or extrahepatic biliary duct dilatation. Liver: No focal lesion identified. Within normal limits in parenchymal echogenicity. Portal vein is patent on color Doppler imaging with normal direction of blood flow towards the liver. IMPRESSION: Study within normal limits. Electronically Signed   By: Lowella Grip III M.D.   On: 09/16/2017 10:31   MDM Labs and [place]  from [facility] reviewed and consistent with 8 week missed AB.  Options for management discussed including expectant management vs cytotec vs D&C Patient has chosen management with Cytotec at this time  Early Intrauterine Pregnancy Failure  __x_  Documented intrauterine pregnancy failure less than or equal to [redacted] weeks gestation  __x_  No serious current illness  __x_  Baseline Hgb greater than or equal to 10g/dl  __x_  Patient has easily accessible transportation to the hospital  _x__  Clear preference  _x__  Practitioner/physician deems patient reliable  _x__  Counseling by practitioner or physician  _N/A__  Patient education by RN  _N/A__  Consent form signed  _N/A__  Rho-Gam given by RN if indicated - per patient O+ blood type, will draw to confirm today   _x__ Medication dispensed   _x__   Cytotec 800 mcg  x__   Intravaginally by patient at home         __   Intravaginally by RN in MAU        __   Rectally by patient at home        __   Rectally by RN in MAU  __x_  Ibuprofen 600 mg 1 tablet by mouth every 6 hours as needed #30  __x_  Oxycodone/acetaminophen 5/325 mg by mouth every 4 to 6 hours as needed  _x__  Phenergan 12.5 mg by mouth every 4 hours  as needed for nausea   Assessment & Plan:  1. Missed ab - misoprostol (CYTOTEC) 200 MCG tablet; Place 4 tabs (800 mcg) vaginally once and repeat in 24 hours if no bleeding  Dispense: 4 tablet; Refill: 1 - oxyCODONE-acetaminophen (PERCOCET/ROXICET) 5-325 MG tablet; Take 1 tablet by mouth every 4 (four) hours as needed for severe pain.  Dispense: 20 tablet; Refill: 0 - ibuprofen (ADVIL,MOTRIN) 600 MG tablet; Take 1 tablet (600 mg total) by mouth every 6 (six) hours as needed.  Dispense: 30 tablet; Refill: 0 - promethazine (PHENERGAN) 12.5 MG tablet; Take 1 tablet (12.5 mg total) by mouth every 6 (six) hours as needed for nausea or vomiting.  Dispense: 30 tablet; Refill: 0 - ABO/Rh - Follow-up for hCG only in 1 week and with a  provider in 2 weeks  - Bleeding precautions discussed - Warning signs for return to MAU discussed   Luvenia Redden, PA-C 09/17/2017 10:00 AM

## 2017-09-18 LAB — ABO/RH: Rh Factor: POSITIVE

## 2017-09-22 ENCOUNTER — Other Ambulatory Visit: Payer: Self-pay | Admitting: *Deleted

## 2017-09-22 DIAGNOSIS — O021 Missed abortion: Secondary | ICD-10-CM

## 2017-09-24 ENCOUNTER — Other Ambulatory Visit: Payer: Medicaid Other

## 2017-10-05 ENCOUNTER — Ambulatory Visit: Payer: Medicaid Other | Admitting: Obstetrics & Gynecology

## 2017-11-02 ENCOUNTER — Ambulatory Visit: Payer: Medicaid Other | Admitting: Nurse Practitioner

## 2018-04-04 ENCOUNTER — Emergency Department (HOSPITAL_BASED_OUTPATIENT_CLINIC_OR_DEPARTMENT_OTHER): Payer: Medicaid Other

## 2018-04-04 ENCOUNTER — Emergency Department (HOSPITAL_BASED_OUTPATIENT_CLINIC_OR_DEPARTMENT_OTHER)
Admission: EM | Admit: 2018-04-04 | Discharge: 2018-04-04 | Disposition: A | Discharge status: Home / Self Care | Payer: Medicaid Other | Attending: Emergency Medicine | Admitting: Emergency Medicine

## 2018-04-04 ENCOUNTER — Other Ambulatory Visit: Payer: Self-pay

## 2018-04-04 ENCOUNTER — Encounter (HOSPITAL_BASED_OUTPATIENT_CLINIC_OR_DEPARTMENT_OTHER): Payer: Self-pay

## 2018-04-04 DIAGNOSIS — Z79899 Other long term (current) drug therapy: Secondary | ICD-10-CM | POA: Insufficient documentation

## 2018-04-04 DIAGNOSIS — R0789 Other chest pain: Secondary | ICD-10-CM | POA: Insufficient documentation

## 2018-04-04 DIAGNOSIS — R079 Chest pain, unspecified: Secondary | ICD-10-CM | POA: Diagnosis present

## 2018-04-04 DIAGNOSIS — J45909 Unspecified asthma, uncomplicated: Secondary | ICD-10-CM | POA: Diagnosis not present

## 2018-04-04 DIAGNOSIS — J0100 Acute maxillary sinusitis, unspecified: Secondary | ICD-10-CM | POA: Insufficient documentation

## 2018-04-04 LAB — BASIC METABOLIC PANEL
Anion gap: 8 (ref 5–15)
BUN: 14 mg/dL (ref 6–20)
CHLORIDE: 103 mmol/L (ref 98–111)
CO2: 24 mmol/L (ref 22–32)
CREATININE: 0.59 mg/dL (ref 0.44–1.00)
Calcium: 9.5 mg/dL (ref 8.9–10.3)
GFR calc non Af Amer: >60 mL/min (ref >60)
Glucose, Bld: 82 mg/dL (ref 70–99)
POTASSIUM: 3.5 mmol/L (ref 3.5–5.1)
SODIUM: 135 mmol/L (ref 135–145)

## 2018-04-04 LAB — CBC
HEMATOCRIT: 33.4 % — AB (ref 36.0–46.0)
HEMOGLOBIN: 9.8 g/dL — AB (ref 12.0–15.0)
MCH: 20.5 pg — ABNORMAL LOW (ref 26.0–34.0)
MCHC: 29.3 g/dL — ABNORMAL LOW (ref 30.0–36.0)
MCV: 70 fL — AB (ref 80.0–100.0)
NRBC: 0 % (ref 0.0–0.2)
Platelets: 374 10*3/uL (ref 150–400)
RBC: 4.77 MIL/uL (ref 3.87–5.11)
RDW: 18.8 % — AB (ref 11.5–15.5)
WBC: 6.8 10*3/uL (ref 4.0–10.5)

## 2018-04-04 LAB — TROPONIN I: Troponin I: <0.03 ng/mL (ref <0.03)

## 2018-04-04 LAB — PREGNANCY, URINE: PREG TEST UR: NEGATIVE

## 2018-04-04 MED ORDER — AMOXICILLIN 500 MG PO CAPS: 500.0000 mg | ORAL_CAPSULE | Freq: Three times a day (TID) | ORAL | 0 refills | Status: DC

## 2018-04-04 MED ORDER — IBUPROFEN 600 MG PO TABS: 600.0000 mg | ORAL_TABLET | Freq: Four times a day (QID) | ORAL | 0 refills | Status: DC | PRN

## 2018-04-04 MED ORDER — AMOXICILLIN 500 MG PO CAPS
500.0000 mg | ORAL_CAPSULE | Freq: Once | ORAL | Status: AC
  Administered 2018-04-04: 500 mg via ORAL
  Filled 2018-04-04: qty 1

## 2018-04-04 NOTE — ED Provider Notes (Signed)
[facility] EMERGENCY DEPARTMENT Provider Note   CSN: [id number] Arrival date & time: 04/04/18  1950     History   Chief Complaint Chief Complaint  Patient presents with  . Chest Pain    HPI Nancy Schultz is a 33 y.o. female.  Pt presents to the ED today with swelling to the right side of her face.  She did have a little CP yesterday, but it went away.  She denies f/c or n/v.  No dental pain.  No sob.     Past Medical History:  Diagnosis Date  . Anemia   . Anemia   . Asthma   . Hx of preeclampsia, prior pregnancy, currently pregnant    Last two weeks of both pregnancies, developed preeclampsia  . Subchorionic hematoma, antepartum 2016   In 2nd pregnancy    There are no active problems to display for this patient.   Past Surgical History:  Procedure Laterality Date  . CESAREAN SECTION     X 2, First due to preeclampsia, second just a repeat c-sect     OB History    Gravida  3   Para  2   Term  2   Preterm  0   AB  0   Living  2     SAB  0   TAB  0   Ectopic  0   Multiple      Live Births  2            Home Medications    Prior to Admission medications   Medication Sig Start Date End Date Taking? Authorizing Provider  amoxicillin (AMOXIL) 500 MG capsule Take 1 capsule (500 mg total) by mouth 3 (three) times daily. 04/04/18   Isla Pence, MD  benzonatate (TESSALON) 100 MG capsule Take 1 capsule (100 mg total) by mouth 3 (three) times daily as needed for cough. 12/02/16   Ward, Ozella Almond, PA-C  ferrous sulfate 325 (65 FE) MG tablet Take 325 mg by mouth daily with breakfast.    [provider]  ibuprofen (ADVIL,MOTRIN) 600 MG tablet Take 1 tablet (600 mg total) by mouth every 6 (six) hours as needed. 04/04/18   Isla Pence, MD  loratadine (CLARITIN) 10 MG tablet Take 1 tablet (10 mg total) by mouth daily. 01/12/15 02/12/15  Mesner, Corene Cornea, MD  loratadine (CLARITIN) 10 MG tablet Take 1 tablet (10 mg total) by  mouth daily as needed for rhinitis. 04/11/15   Sam, Olivia Canter, PA-C  methocarbamol (ROBAXIN) 500 MG tablet Take 1 tablet (500 mg total) by mouth 2 (two) times daily. 04/06/17   Volanda Napoleon, PA-C  misoprostol (CYTOTEC) 200 MCG tablet Place 4 tabs (800 mcg) vaginally once and repeat in 24 hours if no bleeding 09/17/17   Luvenia Redden, PA-C  naproxen (NAPROSYN) 500 MG tablet Take 1 tablet (500 mg total) by mouth 2 (two) times daily. 12/02/16   Ward, Ozella Almond, PA-C  oxyCODONE-acetaminophen (PERCOCET/ROXICET) 5-325 MG tablet Take 1 tablet by mouth every 4 (four) hours as needed for severe pain. 09/17/17   Luvenia Redden, PA-C  promethazine (PHENERGAN) 12.5 MG tablet Take 1 tablet (12.5 mg total) by mouth every 6 (six) hours as needed for nausea or vomiting. 09/17/17   Luvenia Redden, PA-C  sodium chloride (OCEAN) 0.65 % SOLN nasal spray Place 2 sprays into both nostrils as needed for congestion. 04/11/15   Anne Ng, PA-C    Family History No family history on  file.  Social History Social History   Tobacco Use  . Smoking status: Never Smoker  . Smokeless tobacco: Never Used  Substance Use Topics  . Alcohol use: No  . Drug use: No     Allergies   Patient has no known allergies.   Review of Systems Review of Systems  HENT: Positive for facial swelling.   Cardiovascular: Positive for chest pain.  All other systems reviewed and are negative.    Physical Exam Updated Vital Signs BP (!) 119/59   Pulse 71   Temp 98.4 F (36.9 C) (Oral)   Resp 16   Ht 5\' 7"  (1.702 m)   Wt 81.6 kg   LMP 03/29/2018   SpO2 100%   Breastfeeding Unknown   BMI 28.19 kg/m   Physical Exam Vitals signs and nursing note reviewed.  Constitutional:      Appearance: She is well-developed and normal weight.  HENT:     Head: Normocephalic and atraumatic.      Nose:     Right Sinus: Maxillary sinus tenderness present.  Eyes:     Extraocular Movements: Extraocular movements intact.      Pupils: Pupils are equal, round, and reactive to light.  Neck:     Musculoskeletal: Normal range of motion and neck supple.  Cardiovascular:     Rate and Rhythm: Normal rate and regular rhythm.     Heart sounds: Normal heart sounds.  Pulmonary:     Effort: Pulmonary effort is normal.     Breath sounds: Normal breath sounds.  Abdominal:     General: Bowel sounds are normal.     Palpations: Abdomen is soft.  Musculoskeletal: Normal range of motion.  Skin:    General: Skin is warm and dry.     Capillary Refill: Capillary refill takes less than 2 seconds.  Neurological:     General: No focal deficit present.     Mental Status: She is alert and oriented to person, place, and time.  Psychiatric:        Mood and Affect: Mood normal.        Behavior: Behavior normal.      ED Treatments / Results  Labs (all labs ordered are listed, but only abnormal results are displayed) Labs Reviewed  CBC - Abnormal; Notable for the following components:      Result Value   Hemoglobin 9.8 (*)    HCT 33.4 (*)    MCV 70.0 (*)    MCH 20.5 (*)    MCHC 29.3 (*)    RDW 18.8 (*)    All other components within normal limits  BASIC METABOLIC PANEL  TROPONIN I  PREGNANCY, URINE    EKG EKG Interpretation  Date/Time:  Monday April 04 2018 20:08:10 EST Ventricular Rate:  84 PR Interval:  150 QRS Duration: 76 QT Interval:  344 QTC Calculation: 406 R Axis:   45 Text Interpretation:  Normal sinus rhythm with sinus arrhythmia Nonspecific T wave abnormality Abnormal ECG No significant change since last tracing Confirmed by Isla Pence [phone]) on 04/04/2018 8:30:21 PM   Radiology Dg Chest 2 View  Result Date: 04/04/2018 CLINICAL DATA:  Shortness of breath and chest pain EXAM: CHEST - 2 VIEW COMPARISON:  December 02, 2016 FINDINGS: The lungs are clear. The heart size and pulmonary vascularity are normal. No adenopathy. There is lower thoracic levoscoliosis. No pneumothorax. IMPRESSION: No edema or  consolidation. Electronically Signed   By: Lowella Grip III M.D.   On: 04/04/2018 20:53  Procedures Procedures (including critical care time)  Medications Ordered in ED Medications  amoxicillin (AMOXIL) capsule 500 mg (has no administration in time range)     Initial Impression / Assessment and Plan / ED Course  I have reviewed the triage vital signs and the nursing notes.  Pertinent labs & imaging results that were available during my care of the patient were reviewed by me and considered in my medical decision making (see chart for details).  Cardiac work up is negative.  No suspicion for PE.   Pt will be started on Amox.  She is instructed to return if worse and to f/u with pcp.  Final Clinical Impressions(s) / ED Diagnoses   Final diagnoses:  Acute non-recurrent maxillary sinusitis  Atypical chest pain    ED Discharge Orders         Ordered    amoxicillin (AMOXIL) 500 MG capsule  3 times daily     04/04/18 2254    ibuprofen (ADVIL,MOTRIN) 600 MG tablet  Every 6 hours PRN     04/04/18 2254           Isla Pence, MD 04/04/18 2257

## 2018-04-04 NOTE — ED Triage Notes (Signed)
C/o intermittent right side CP started last night-woke at 12pm today with pain to right side of face-denies CP-NAD-steady gait

## 2018-04-04 NOTE — ED Notes (Signed)
Pt states today after chest pain went away pt had right sided facial pain. Pt states that she thought it was a migraine but when she waited the face pain had gotten worse. Pt states no numbness just pain. Pt states she has never had a migraine with facial pain before.
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
## 2021-03-15 ENCOUNTER — Emergency Department (HOSPITAL_BASED_OUTPATIENT_CLINIC_OR_DEPARTMENT_OTHER)
Admission: EM | Admit: 2021-03-15 | Discharge: 2021-03-16 | Disposition: A | Discharge status: Home / Self Care | Payer: Medicaid Other | Attending: Emergency Medicine | Admitting: Emergency Medicine

## 2021-03-15 ENCOUNTER — Encounter (HOSPITAL_BASED_OUTPATIENT_CLINIC_OR_DEPARTMENT_OTHER): Payer: Self-pay | Admitting: Emergency Medicine

## 2021-03-15 ENCOUNTER — Other Ambulatory Visit: Payer: Self-pay

## 2021-03-15 ENCOUNTER — Emergency Department (HOSPITAL_BASED_OUTPATIENT_CLINIC_OR_DEPARTMENT_OTHER): Payer: Medicaid Other

## 2021-03-15 DIAGNOSIS — M25521 Pain in right elbow: Secondary | ICD-10-CM | POA: Diagnosis present

## 2021-03-15 DIAGNOSIS — J45909 Unspecified asthma, uncomplicated: Secondary | ICD-10-CM | POA: Diagnosis not present

## 2021-03-15 DIAGNOSIS — M7021 Olecranon bursitis, right elbow: Secondary | ICD-10-CM | POA: Insufficient documentation

## 2021-03-15 DIAGNOSIS — Y9389 Activity, other specified: Secondary | ICD-10-CM | POA: Diagnosis not present

## 2021-03-15 MED ORDER — NAPROXEN 375 MG PO TABS: ORAL_TABLET | ORAL | 0 refills | Status: DC

## 2021-03-15 MED ORDER — NAPROXEN 250 MG PO TABS
500.0000 mg | ORAL_TABLET | Freq: Once | ORAL | Status: AC
  Administered 2021-03-16: 500 mg via ORAL
  Filled 2021-03-15: qty 2

## 2021-03-15 NOTE — ED Provider Notes (Signed)
[facility] Provider Note: Georgena Spurling, MD, FACEP  CSN: [id number] MRN: [id number] ARRIVAL: 03/15/21 at 2209 ROOM: [facility]  Elbow Pain   HISTORY OF PRESENT ILLNESS  03/15/21 10:51 PM Nancy Schultz is a 35 y.o. female who struck her right elbow on a metal cart at work 02/23/2021.  She was seen at an urgent care and diagnosed with bursitis.  X-rays were not done.  She has persistent pain and swelling which has not responded to over-the-counter medications.  She rates her pain as a 7 out of 10, worse with movement or palpation.   Past Medical History:  Diagnosis Date   Anemia    Asthma    Hx of preeclampsia, prior pregnancy, currently pregnant    Last two weeks of both pregnancies, developed preeclampsia   Subchorionic hematoma, antepartum 2016   In 2nd pregnancy    Past Surgical History:  Procedure Laterality Date   CESAREAN SECTION     X 2, First due to preeclampsia, second just a repeat c-sect    No family history on file.  Social History   Tobacco Use   Smoking status: Never   Smokeless tobacco: Never  Vaping Use   Vaping Use: Never used  Substance Use Topics   Alcohol use: No   Drug use: No    Prior to Admission medications   Medication Sig Start Date End Date Taking? Authorizing Provider  naproxen (NAPROSYN) 375 MG tablet Take 1 tablet twice daily for elbow pain. 03/15/21  Yes Johsua Shevlin, MD  ferrous sulfate 325 (65 FE) MG tablet Take 325 mg by mouth daily with breakfast.    [provider]    Allergies Patient has no known allergies.   REVIEW OF SYSTEMS  Negative except as noted here or in the History of Present Illness.   PHYSICAL EXAMINATION  Initial Vital Signs Blood pressure 127/71, pulse 86, temperature 98.2 F (36.8 C), temperature source Oral, resp. rate 16, SpO2 100 %, unknown if currently breastfeeding.  Examination General: Well-developed, well-nourished female in no acute distress;  appearance consistent with age of record HENT: normocephalic; atraumatic Eyes: Normal appearance Neck: supple Heart: regular rate and rhythm Lungs: clear to auscultation bilaterally Abdomen: soft; nondistended; nontender; bowel sounds present Extremities: No deformity; full range of motion; tender, fluctuant swelling of right olecranon bursa without erythema or warmth Neurologic: Awake, alert and oriented; motor function intact in all extremities and symmetric; no facial droop Skin: Warm and dry Psychiatric: Normal mood and affect   RESULTS  Summary of this visit's results, reviewed and interpreted by myself:   EKG Interpretation  Date/Time:    Ventricular Rate:    PR Interval:    QRS Duration:   QT Interval:    QTC Calculation:   R Axis:     Text Interpretation:         Laboratory Studies: No results found for this or any previous visit (from the past 24 hour(s)). Imaging Studies: DG Elbow Complete Right  Result Date: 03/15/2021 CLINICAL DATA:  Right elbow pain and swelling EXAM: RIGHT ELBOW - COMPLETE 3+ VIEW COMPARISON:  None. FINDINGS: There is no evidence of fracture, dislocation, or joint effusion. There is no evidence of arthropathy or other focal bone abnormality. Moderate soft tissue swelling superficial to the olecranon. No retained radiopaque foreign body. IMPRESSION: Soft tissue swelling.  No fracture or dislocation. Electronically Signed   By: Fidela Salisbury M.D.   On: 03/15/2021 23:19    ED  COURSE and MDM  Nursing notes, initial and subsequent vitals signs, including pulse oximetry, reviewed and interpreted by myself.  Vitals:   03/15/21 2219  BP: 127/71  Pulse: 86  Resp: 16  Temp: 98.2 F (36.8 C)  TempSrc: Oral  SpO2: 100%   Medications  naproxen (NAPROSYN) tablet 500 mg (has no administration in time range)   Examination is consistent with olecranon bursitis.  I did not think this represents a septic bursitis as there was no penetrating injury  and there is no erythema or warmth.  The patient may benefit from steroid injection and we will refer her to sports medicine.   PROCEDURES  Procedures   ED DIAGNOSES     ICD-10-CM   1. Olecranon bursitis of right elbow  M70.21          Ellarie Picking, Jenny Reichmann, MD 03/15/21 2327

## 2021-03-15 NOTE — ED Triage Notes (Signed)
Reports hitting her right elbow on a metal cart at work on 11/27. Was since seen by UC and dx with bursitis. Reports uc did not do x-rays. Pain and swelling has not improved and no relief with otc meds.

## 2021-03-19 ENCOUNTER — Ambulatory Visit: Payer: Self-pay

## 2021-03-19 ENCOUNTER — Encounter: Payer: Self-pay | Admitting: Family Medicine

## 2021-03-19 ENCOUNTER — Ambulatory Visit (INDEPENDENT_AMBULATORY_CARE_PROVIDER_SITE_OTHER): Payer: Medicaid Other | Admitting: Family Medicine

## 2021-03-19 VITALS — BP 120/82 | Ht 67.0 in | Wt 204.0 lb

## 2021-03-19 DIAGNOSIS — M7021 Olecranon bursitis, right elbow: Secondary | ICD-10-CM

## 2021-03-19 MED ORDER — PREDNISONE 5 MG PO TABS: ORAL_TABLET | ORAL | 0 refills | Status: DC

## 2021-03-19 NOTE — Assessment & Plan Note (Signed)
Injury occurred while at work on 11/27.  Still having ongoing swelling.  Does seem to have some extension into the cubital tunnel. -Counseled on home exercise therapy and supportive care. -Prednisone. -Provided work note. -Could consider aspiration

## 2021-03-19 NOTE — Progress Notes (Signed)
°  Nancy Schultz - 35 y.o. female MRN [id number]  Date of birth: Aug 09, 1985  SUBJECTIVE:  Including CC & ROS.  No chief complaint on file.   Nancy Schultz is a 35 y.o. female that is presenting with right elbow pain.  The pain has been ongoing since her injury at work on 11/27.  Pain is involving the posterior aspect of the elbow.  No history of similar pain.  Seems to be worse with activity.  Independent review of the right elbow x-ray from 12/17 shows soft tissue swelling.  Review of Systems See HPI   HISTORY: Past Medical, Surgical, Social, and Family History Reviewed & Updated per EMR.   Pertinent Historical Findings include:  Past Medical History:  Diagnosis Date   Anemia    Asthma    Hx of preeclampsia, prior pregnancy, currently pregnant    Last two weeks of both pregnancies, developed preeclampsia   Subchorionic hematoma, antepartum 2016   In 2nd pregnancy    Past Surgical History:  Procedure Laterality Date   CESAREAN SECTION     X 2, First due to preeclampsia, second just a repeat c-sect    History reviewed. No pertinent family history.  Social History   Socioeconomic History   Marital status: Single    Spouse name: Not on file   Number of children: Not on file   Years of education: Not on file   Highest education level: Not on file  Occupational History   Not on file  Tobacco Use   Smoking status: Never   Smokeless tobacco: Never  Vaping Use   Vaping Use: Never used  Substance and Sexual Activity   Alcohol use: No   Drug use: No   Sexual activity: Not on file  Other Topics Concern   Not on file  Social History Narrative   Not on file   Social Determinants of Health   Financial Resource Strain: Not on file  Food Insecurity: Not on file  Transportation Needs: Not on file  Physical Activity: Not on file  Stress: Not on file  Social Connections: Not on file  Intimate Partner Violence: Not on file     PHYSICAL EXAM:  VS: BP 120/82  (BP Location: Left Arm, Patient Position: Sitting)    Ht 5\' 7"  (1.702 m)    Wt 204 lb (92.5 kg)    BMI 31.95 kg/m  Physical Exam Gen: NAD, alert, cooperative with exam, well-appearing   Limited ultrasound: Right elbow:  Complicated cystic change of the olecranon bursitis. Normal insertion of the triceps tendon. Normal-appearing lateral condyle. There is soft tissue swelling that does emanate over to the cubital tunnel.  Summary: Olecranon bursitis  Ultrasound and interpretation by Clearance Coots, MD     ASSESSMENT & PLAN:   Olecranon bursitis of right elbow Injury occurred while at work on 11/27.  Still having ongoing swelling.  Does seem to have some extension into the cubital tunnel. -Counseled on home exercise therapy and supportive care. -Prednisone. -Provided work note. -Could consider aspiration

## 2021-03-19 NOTE — Patient Instructions (Signed)
Nice to meet you Please try ice  Please continue padding the area   Please send me a message in [place] with any questions or updates.  Please see me back in 2 weeks.   --Dr. Raeford Razor

## 2021-04-02 ENCOUNTER — Ambulatory Visit: Payer: Medicaid Other | Admitting: Family Medicine

## 2021-04-03 ENCOUNTER — Inpatient Hospital Stay (EMERGENCY_DEPARTMENT_HOSPITAL)
Admission: AD | Admit: 2021-04-03 | Discharge: 2021-04-03 | Disposition: A | Discharge status: Home / Self Care | Payer: Medicaid Other | Source: Home / Self Care | Attending: Obstetrics and Gynecology | Admitting: Obstetrics and Gynecology

## 2021-04-03 ENCOUNTER — Emergency Department (HOSPITAL_BASED_OUTPATIENT_CLINIC_OR_DEPARTMENT_OTHER)
Admission: EM | Admit: 2021-04-03 | Discharge: 2021-04-03 | Disposition: A | Discharge status: Home / Self Care | Payer: Medicaid Other | Attending: Emergency Medicine | Admitting: Emergency Medicine

## 2021-04-03 ENCOUNTER — Inpatient Hospital Stay (HOSPITAL_BASED_OUTPATIENT_CLINIC_OR_DEPARTMENT_OTHER): Payer: Medicaid Other

## 2021-04-03 ENCOUNTER — Inpatient Hospital Stay (HOSPITAL_COMMUNITY): Payer: Medicaid Other

## 2021-04-03 ENCOUNTER — Encounter (HOSPITAL_BASED_OUTPATIENT_CLINIC_OR_DEPARTMENT_OTHER): Payer: Self-pay | Admitting: *Deleted

## 2021-04-03 ENCOUNTER — Other Ambulatory Visit: Payer: Self-pay

## 2021-04-03 ENCOUNTER — Encounter (HOSPITAL_COMMUNITY): Payer: Self-pay | Admitting: Obstetrics and Gynecology

## 2021-04-03 DIAGNOSIS — R1030 Lower abdominal pain, unspecified: Secondary | ICD-10-CM | POA: Insufficient documentation

## 2021-04-03 DIAGNOSIS — O26899 Other specified pregnancy related conditions, unspecified trimester: Secondary | ICD-10-CM | POA: Insufficient documentation

## 2021-04-03 DIAGNOSIS — Z3A01 Less than 8 weeks gestation of pregnancy: Secondary | ICD-10-CM | POA: Insufficient documentation

## 2021-04-03 DIAGNOSIS — R109 Unspecified abdominal pain: Secondary | ICD-10-CM

## 2021-04-03 DIAGNOSIS — O26891 Other specified pregnancy related conditions, first trimester: Secondary | ICD-10-CM

## 2021-04-03 DIAGNOSIS — Z5321 Procedure and treatment not carried out due to patient leaving prior to being seen by health care provider: Secondary | ICD-10-CM | POA: Insufficient documentation

## 2021-04-03 DIAGNOSIS — Z3A00 Weeks of gestation of pregnancy not specified: Secondary | ICD-10-CM | POA: Insufficient documentation

## 2021-04-03 DIAGNOSIS — O3680X0 Pregnancy with inconclusive fetal viability, not applicable or unspecified: Secondary | ICD-10-CM | POA: Diagnosis not present

## 2021-04-03 LAB — CBC
HCT: 27.9 % — ABNORMAL LOW (ref 36.0–46.0)
Hemoglobin: 8.4 g/dL — ABNORMAL LOW (ref 12.0–15.0)
MCH: 19.9 pg — ABNORMAL LOW (ref 26.0–34.0)
MCHC: 30.1 g/dL (ref 30.0–36.0)
MCV: 66 fL — ABNORMAL LOW (ref 80.0–100.0)
Platelets: 309 10*3/uL (ref 150–400)
RBC: 4.23 MIL/uL (ref 3.87–5.11)
RDW: 21.2 % — ABNORMAL HIGH (ref 11.5–15.5)
WBC: 5.8 10*3/uL (ref 4.0–10.5)
nRBC: 0 % (ref 0.0–0.2)

## 2021-04-03 LAB — URINALYSIS, ROUTINE W REFLEX MICROSCOPIC
Bilirubin Urine: NEGATIVE
Glucose, UA: NEGATIVE mg/dL
Hgb urine dipstick: NEGATIVE
Ketones, ur: NEGATIVE mg/dL
Leukocytes,Ua: NEGATIVE
Nitrite: NEGATIVE
Protein, ur: NEGATIVE mg/dL
Specific Gravity, Urine: 1.028 (ref 1.005–1.030)
pH: 5 (ref 5.0–8.0)

## 2021-04-03 LAB — WET PREP, GENITAL
Clue Cells Wet Prep HPF POC: NONE SEEN
Sperm: NONE SEEN
Trich, Wet Prep: NONE SEEN
WBC, Wet Prep HPF POC: >=10 — AB (ref <10)
Yeast Wet Prep HPF POC: NONE SEEN

## 2021-04-03 LAB — HCG, QUANTITATIVE, PREGNANCY: hCG, Beta Chain, Quant, S: 3740 m[IU]/mL — ABNORMAL HIGH (ref <5)

## 2021-04-03 NOTE — ED Triage Notes (Signed)
Positive pregnancy test at UC this am. Lower abdominal cramps for a week. No bleeding.

## 2021-04-03 NOTE — MAU Note (Signed)
Fatima Blank CNM in Triage discussing test results and d/c plan with pt. Pt then d/c home by [facility]

## 2021-04-03 NOTE — MAU Provider Note (Signed)
Chief Complaint: Abdominal Pain   None     SUBJECTIVE HPI: Nancy Schultz is a 36 y.o. [id number] at [redacted]w[redacted]d by LMP who presents to maternity admissions reporting onset of cramping abdominal pain 1 week ago.  She has some white vaginal discharge without itching/burning. There is no vaginal bleeding.  She had positive pregnancy test at urgent care today and was sent to MAU for further evaluation.  She denies h/a, chest pain, or dizziness.     HPI  Past Medical History:  Diagnosis Date   Anemia    Asthma    Hx of preeclampsia, prior pregnancy, currently pregnant    Last two weeks of both pregnancies, developed preeclampsia   Subchorionic hematoma, antepartum 2016   In 2nd pregnancy   Past Surgical History:  Procedure Laterality Date   CESAREAN SECTION     X 2, First due to preeclampsia, second just a repeat c-sect   Social History   Socioeconomic History   Marital status: Single    Spouse name: Not on file   Number of children: Not on file   Years of education: Not on file   Highest education level: Not on file  Occupational History   Not on file  Tobacco Use   Smoking status: Never   Smokeless tobacco: Never  Vaping Use   Vaping Use: Never used  Substance and Sexual Activity   Alcohol use: No   Drug use: No   Sexual activity: Not on file  Other Topics Concern   Not on file  Social History Narrative   Not on file   Social Determinants of Health   Financial Resource Strain: Not on file  Food Insecurity: Not on file  Transportation Needs: Not on file  Physical Activity: Not on file  Stress: Not on file  Social Connections: Not on file  Intimate Partner Violence: Not on file   No current facility-administered medications on file prior to encounter.   Current Outpatient Medications on File Prior to Encounter  Medication Sig Dispense Refill   ferrous sulfate 325 (65 FE) MG tablet Take 325 mg by mouth daily with breakfast.     predniSONE (DELTASONE) 5 MG tablet  Take 6 pills for first day, 5 pills second day, 4 pills third day, 3 pills fourth day, 2 pills the fifth day, and 1 pill sixth day. 21 tablet 0   No Known Allergies  ROS:  Review of Systems  Constitutional:  Negative for chills, fatigue and fever.  Respiratory:  Negative for shortness of breath.   Cardiovascular:  Negative for chest pain.  Gastrointestinal:  Positive for abdominal pain. Negative for nausea and vomiting.  Genitourinary:  Negative for difficulty urinating, dysuria, flank pain, pelvic pain, vaginal bleeding, vaginal discharge and vaginal pain.  Neurological:  Negative for dizziness and headaches.  Psychiatric/Behavioral: Negative.      I have reviewed patient's Past Medical Hx, Surgical Hx, Family Hx, Social Hx, medications and allergies.   Physical Exam  Patient Vitals for the past 24 hrs:  BP Temp Pulse Resp Height Weight  04/03/21 1608 113/72 98.1 F (36.7 C) 72 18 5\' 7"  (1.702 m) 93.9 kg   Constitutional: Well-developed, well-nourished female in no acute distress.  Cardiovascular: normal rate Respiratory: normal effort GI: Abd soft, non-tender. Pos BS x 4 MS: Extremities nontender, no edema, normal ROM Neurologic: Alert and oriented x 4.  GU: Neg CVAT.  PELVIC EXAM: vaginal cultures collected by blind swab    LAB RESULTS Results for orders placed  or performed during the hospital encounter of 04/03/21 (from the past 24 hour(s))  Urinalysis, Routine w reflex microscopic Urine, Clean Catch     Status: Abnormal   Collection Time: 04/03/21  5:26 PM  Result Value Ref Range   Color, Urine YELLOW YELLOW   APPearance HAZY (A) CLEAR   Specific Gravity, Urine 1.028 1.005 - 1.030   pH 5.0 5.0 - 8.0   Glucose, UA NEGATIVE NEGATIVE mg/dL   Hgb urine dipstick NEGATIVE NEGATIVE   Bilirubin Urine NEGATIVE NEGATIVE   Ketones, ur NEGATIVE NEGATIVE mg/dL   Protein, ur NEGATIVE NEGATIVE mg/dL   Nitrite NEGATIVE NEGATIVE   Leukocytes,Ua NEGATIVE NEGATIVE  CBC      Status: Abnormal   Collection Time: 04/03/21  5:45 PM  Result Value Ref Range   WBC 5.8 4.0 - 10.5 K/uL   RBC 4.23 3.87 - 5.11 MIL/uL   Hemoglobin 8.4 (L) 12.0 - 15.0 g/dL   HCT 27.9 (L) 36.0 - 46.0 %   MCV 66.0 (L) 80.0 - 100.0 fL   MCH 19.9 (L) 26.0 - 34.0 pg   MCHC 30.1 30.0 - 36.0 g/dL   RDW 21.2 (H) 11.5 - 15.5 %   Platelets 309 150 - 400 K/uL   nRBC 0.0 0.0 - 0.2 %  hCG, quantitative, pregnancy     Status: Abnormal   Collection Time: 04/03/21  5:45 PM  Result Value Ref Range   hCG, Beta Chain, Quant, S 3,740 (H) <5 mIU/mL  Wet prep, genital     Status: Abnormal   Collection Time: 04/03/21  5:50 PM  Result Value Ref Range   Yeast Wet Prep HPF POC NONE SEEN NONE SEEN   Trich, Wet Prep NONE SEEN NONE SEEN   Clue Cells Wet Prep HPF POC NONE SEEN NONE SEEN   WBC, Wet Prep HPF POC >=10 (A) <10   Sperm NONE SEEN        IMAGING DG Elbow Complete Right  Result Date: 03/15/2021 CLINICAL DATA:  Right elbow pain and swelling EXAM: RIGHT ELBOW - COMPLETE 3+ VIEW COMPARISON:  None. FINDINGS: There is no evidence of fracture, dislocation, or joint effusion. There is no evidence of arthropathy or other focal bone abnormality. Moderate soft tissue swelling superficial to the olecranon. No retained radiopaque foreign body. IMPRESSION: Soft tissue swelling.  No fracture or dislocation. Electronically Signed   By: Fidela Salisbury M.D.   On: 03/15/2021 23:19   US OB LESS THAN 14 WEEKS WITH OB TRANSVAGINAL  Result Date: 04/03/2021 CLINICAL DATA:  Cramping and vaginal bleeding. Last menstrual period 02/28/2021. Gestational age by last menstrual period 4 weeks and 1 day. Estimated due date 11/09/2021. EXAM: OBSTETRIC <14 WK [place] AND TRANSVAGINAL OB US TECHNIQUE: Both transabdominal and transvaginal ultrasound examinations were performed for complete evaluation of the gestation as well as the maternal uterus, adnexal regions, and pelvic cul-de-sac. Transvaginal technique was performed to assess early  pregnancy. COMPARISON:  None. FINDINGS: Intrauterine gestational sac: Single Yolk sac:  Not Visualized. Embryo:  Not Visualized. Cardiac Activity: Not Visualized. Heart Rate:   bpm MSD: 4 mm   5 w   1 d Subchorionic hemorrhage:  None visualized. Maternal uterus/adnexae: Bilateral ovaries are unremarkable. Corpus luteum cyst noted within the left ovary. Other: Trace free fluid within the pelvis. IMPRESSION: Probable early intrauterine gestational sac, but no yolk sac, fetal pole, or cardiac activity yet visualized. Recommend follow-up quantitative B-HCG levels and follow-up US in 14 days to assess viability. This recommendation follows SRU consensus  guidelines: Diagnostic Criteria for Nonviable Pregnancy Early in the First Trimester. [place] [employer] Med 2013; 458:0998-33. Electronically Signed   By: Iven Finn M.D.   On: 04/03/2021 19:22    MAU Management/MDM: Orders Placed This Encounter  Procedures   Wet prep, genital   US OB LESS THAN 14 WEEKS WITH OB TRANSVAGINAL   Urinalysis, Routine w reflex microscopic Urine, Clean Catch   CBC   hCG, quantitative, pregnancy   Discharge patient    No orders of the defined types were placed in this encounter.   Findings today could represent a normal early pregnancy, spontaneous abortion or ectopic pregnancy which can be life-threatening.  Ectopic precautions were given to the patient with plan to return in 48 hours for repeat quant hcg to evaluate pregnancy development.  Appt for MAU stat hcg on 04/05/21.  Return precautions reviewed.   ASSESSMENT 1. Pregnancy of unknown anatomic location   2. Abdominal pain during pregnancy in first trimester     PLAN Discharge home Allergies as of 04/03/2021   No Known Allergies      Medication List     STOP taking these medications    naproxen 375 MG tablet Commonly known as: NAPROSYN       TAKE these medications    ferrous sulfate 325 (65 FE) MG tablet Take 325 mg by mouth daily with breakfast.    predniSONE 5 MG tablet Commonly known as: DELTASONE Take 6 pills for first day, 5 pills second day, 4 pills third day, 3 pills fourth day, 2 pills the fifth day, and 1 pill sixth day.        Follow-up Information     [facility] Follow up.   Specialty: Obstetrics and Gynecology Why: On Saturday, 04/05/21 at 11 am or sooner as needed for emergencies. Contact information: [address] [id number] [place]                Fatima Blank Certified Nurse-Midwife 04/03/2021  8:21 PM

## 2021-04-03 NOTE — MAU Note (Signed)
Pt reports she has been having cramping on and off x 1 week. Reports some white vag discharge denies any bleeding. Was at urgent care earlier and sent to MAU for further eval.

## 2021-04-04 LAB — GC/CHLAMYDIA PROBE AMP (~~LOC~~) NOT AT ARMC
Chlamydia: NEGATIVE
Comment: NEGATIVE
Comment: NORMAL
Neisseria Gonorrhea: NEGATIVE

## 2021-04-05 ENCOUNTER — Other Ambulatory Visit (HOSPITAL_COMMUNITY)
Admit: 2021-04-05 | Discharge: 2021-04-05 | Disposition: A | Discharge status: Home / Self Care | Payer: Medicaid Other | Attending: Obstetrics and Gynecology | Admitting: Obstetrics and Gynecology

## 2021-04-05 ENCOUNTER — Inpatient Hospital Stay (HOSPITAL_COMMUNITY)
Admission: AD | Admit: 2021-04-05 | Discharge: 2021-04-05 | Disposition: A | Discharge status: Home / Self Care | Payer: Medicaid Other | Attending: Obstetrics and Gynecology | Admitting: Obstetrics and Gynecology

## 2021-04-05 ENCOUNTER — Encounter (HOSPITAL_COMMUNITY): Payer: Self-pay | Admitting: Obstetrics and Gynecology

## 2021-04-05 ENCOUNTER — Other Ambulatory Visit: Payer: Self-pay

## 2021-04-05 DIAGNOSIS — Z3A01 Less than 8 weeks gestation of pregnancy: Secondary | ICD-10-CM | POA: Diagnosis not present

## 2021-04-05 DIAGNOSIS — O3680X0 Pregnancy with inconclusive fetal viability, not applicable or unspecified: Secondary | ICD-10-CM | POA: Diagnosis not present

## 2021-04-05 LAB — HCG, QUANTITATIVE, PREGNANCY: hCG, Beta Chain, Quant, S: 5402 m[IU]/mL — ABNORMAL HIGH (ref <5)

## 2021-04-05 NOTE — MAU Provider Note (Signed)
History   Chief Complaint:  Follow-up   Nancy Schultz is  36 y.o. [id number] Patient's last menstrual period was 03/05/2021.Marland Kitchen Patient is here for follow up of quantitative HCG and ongoing surveillance of pregnancy status. She is [redacted]w[redacted]d weeks gestation  by LMP.    Since her last visit, the patient is without new complaint. The patient reports bleeding as  none now.  She denies any pain.  General ROS:  negative  Her previous Quantitative HCG values are:  3740 on 1/5 Ultrasound showed empty IUGS   Physical Exam   Blood pressure 106/60, pulse 81, temperature 98.5 F (36.9 C), temperature source Oral, resp. rate 17, last menstrual period 03/05/2021, SpO2 99 %, unknown if currently breastfeeding.  Physical Examination: General appearance - alert, well appearing, and in no distress Mental status - normal mood, behavior, speech, dress, motor activity, and thought processes Eyes - sclera anicteric Chest - normal respiratory effort  Labs: Results for orders placed or performed during the hospital encounter of 04/05/21 (from the past 24 hour(s))  hCG, quantitative, pregnancy   Collection Time: 04/05/21 11:17 AM  Result Value Ref Range   hCG, Beta Chain, Quant, S 5,402 (H) <5 mIU/mL    Ultrasound Studies:   No results found.  Assessment:   1. Pregnancy of unknown anatomic location   2. [redacted] weeks gestation of pregnancy     -44% rise in HCG. Pt asymptomatic. Reviewed with Dr. Ilda Basset who recommends f/u ultrasound  Plan: -Discharge home in stable condition -Bleeding/pain precautions discussed -Patient advised to follow-up with ultrasound in ~10 days - order placed -Patient may return to MAU as needed or if her condition were to change or worsen  Jorje Guild, NP 04/05/2021, 4:03 PM

## 2021-04-05 NOTE — MAU Note (Signed)
Amenda Luck is a 36 y.o. at [redacted]w[redacted]d here in MAU reporting: here for follow up hcg. Denies bleeding. States mild cramping.  Onset of complaint: ongoing  Pain score: 3/10  Vitals:   04/05/21 1120  BP: 106/60  Pulse: 81  Resp: 17  Temp: 98.5 F (36.9 C)  SpO2: 99%     Lab orders placed from triage: hcg

## 2021-04-05 NOTE — Discharge Instructions (Signed)
Return to care  If you have heavier bleeding that soaks through more than 2 pads per hour for an hour or more If you bleed so much that you feel like you might pass out or you do pass out If you have significant abdominal pain that is not improved with Tylenol     [facility] for [facility] at [facility]  [address] [place], [place] [address]  [place] for [place] at [address], [place], [place] [address]  [place] [facility] [place] at [facility] [address], [place], [place] [address]  [place] for [place] at [facility]  [address] Homero [place]ellers [place], [place] [address]  [phone]  [facility] [place] at [facility] for [place] (First floor), [place], [place] [address]  [place] for [facility] at [place], [place], [place] [address] [place] for [place] at [facility]  [place], [place], [place] [address]  [phone]  [facility]  [address], [place], [place] [address]  [place]  [place], [place], [place] [address]  [phone]  [facility]  [address] Jaclynn Guarneri [place], [place] [address]  [place] [address], [place], [place] [address]  [phone]  [facility]  [address], [place], [place] [address]  [phone]  [place]   [address] Johnette Abraham [place], [place] [address]  [phone]  [facility] [place] [address], [place], [place] [address]   [phone]  [facility]  [address] [place],  [address]  [phone]

## 2021-04-08 ENCOUNTER — Encounter: Payer: Self-pay | Admitting: Family Medicine

## 2021-04-08 ENCOUNTER — Ambulatory Visit (INDEPENDENT_AMBULATORY_CARE_PROVIDER_SITE_OTHER): Payer: Medicaid Other | Admitting: Family Medicine

## 2021-04-08 DIAGNOSIS — M7021 Olecranon bursitis, right elbow: Secondary | ICD-10-CM | POA: Diagnosis not present

## 2021-04-08 NOTE — Assessment & Plan Note (Signed)
Resolution of the bursitis since her injury at work.  Has normal range of motion.  Has some soreness intermittently.  -Counseled on home exercise therapy and supportive care. -Could consider physical therapy or injection.

## 2021-04-08 NOTE — Progress Notes (Signed)
°  Nancy Schultz - 36 y.o. female MRN [id number]  Date of birth: Nov 22, 1985  SUBJECTIVE:  Including CC & ROS.  No chief complaint on file.   Nancy Schultz is a 36 y.o. female that is following up for her right elbow pain.  Her bursitis has improved.  Her range of motion is normal.  She only has minor pain in the extensors when she is using her arm more.    Review of Systems See HPI   HISTORY: Past Medical, Surgical, Social, and Family History Reviewed & Updated per EMR.   Pertinent Historical Findings include:  Past Medical History:  Diagnosis Date   Anemia    Asthma    Hx of preeclampsia, prior pregnancy, currently pregnant    Last two weeks of both pregnancies, developed preeclampsia    Past Surgical History:  Procedure Laterality Date   CESAREAN SECTION     X 2, First due to preeclampsia, second just a repeat c-sect     PHYSICAL EXAM:  VS: BP 110/60 (BP Location: Left Arm, Patient Position: Sitting)    Ht 5\' 7"  (1.702 m)    Wt 207 lb (93.9 kg)    LMP 03/05/2021    BMI 32.42 kg/m  Physical Exam Gen: NAD, alert, cooperative with exam, well-appearing MSK: Neurovascularly intact       ASSESSMENT & PLAN:   Olecranon bursitis of right elbow Resolution of the bursitis since her injury at work.  Has normal range of motion.  Has some soreness intermittently.  -Counseled on home exercise therapy and supportive care. -Could consider physical therapy or injection.

## 2021-04-08 NOTE — Patient Instructions (Signed)
Good to see you Please use ice as needed  Please send me a message in MyChart with any questions or updates.  Please see me back as needed.   --Dr. Araceli Coufal  

## 2021-04-14 ENCOUNTER — Ambulatory Visit
Admission: RE | Admit: 2021-04-14 | Discharge: 2021-04-14 | Disposition: A | Discharge status: Home / Self Care | Payer: Medicaid Other | Source: Ambulatory Visit | Attending: Student | Admitting: Student

## 2021-04-14 ENCOUNTER — Telehealth: Payer: Self-pay | Admitting: Student

## 2021-04-14 ENCOUNTER — Other Ambulatory Visit: Payer: Self-pay

## 2021-04-14 DIAGNOSIS — O3680X0 Pregnancy with inconclusive fetal viability, not applicable or unspecified: Secondary | ICD-10-CM | POA: Insufficient documentation

## 2021-04-14 DIAGNOSIS — Z3A01 Less than 8 weeks gestation of pregnancy: Secondary | ICD-10-CM | POA: Insufficient documentation

## 2021-04-14 NOTE — Telephone Encounter (Signed)
Attempted to contact patient regarding ultrasound results. No answer. Will send mychart message.   Jorje Guild, NP

## 2021-05-16 ENCOUNTER — Other Ambulatory Visit: Payer: Self-pay

## 2021-05-16 ENCOUNTER — Encounter (HOSPITAL_COMMUNITY): Payer: Self-pay | Admitting: Obstetrics & Gynecology

## 2021-05-16 ENCOUNTER — Inpatient Hospital Stay (HOSPITAL_COMMUNITY): Payer: Medicaid Other

## 2021-05-16 ENCOUNTER — Inpatient Hospital Stay (HOSPITAL_COMMUNITY)
Admission: AD | Admit: 2021-05-16 | Discharge: 2021-05-16 | Disposition: A | Discharge status: Home / Self Care | Payer: Medicaid Other | Attending: Obstetrics & Gynecology | Admitting: Obstetrics & Gynecology

## 2021-05-16 DIAGNOSIS — O99011 Anemia complicating pregnancy, first trimester: Secondary | ICD-10-CM | POA: Diagnosis not present

## 2021-05-16 DIAGNOSIS — J45909 Unspecified asthma, uncomplicated: Secondary | ICD-10-CM | POA: Insufficient documentation

## 2021-05-16 DIAGNOSIS — Z3A10 10 weeks gestation of pregnancy: Secondary | ICD-10-CM | POA: Diagnosis not present

## 2021-05-16 DIAGNOSIS — O021 Missed abortion: Secondary | ICD-10-CM | POA: Diagnosis not present

## 2021-05-16 DIAGNOSIS — O3680X0 Pregnancy with inconclusive fetal viability, not applicable or unspecified: Secondary | ICD-10-CM

## 2021-05-16 DIAGNOSIS — Z3A01 Less than 8 weeks gestation of pregnancy: Secondary | ICD-10-CM

## 2021-05-16 DIAGNOSIS — O99019 Anemia complicating pregnancy, unspecified trimester: Secondary | ICD-10-CM

## 2021-05-16 DIAGNOSIS — R0602 Shortness of breath: Secondary | ICD-10-CM | POA: Diagnosis not present

## 2021-05-16 LAB — CBC WITH DIFFERENTIAL/PLATELET
Abs Immature Granulocytes: 0.01 10*3/uL (ref 0.00–0.07)
Basophils Absolute: 0 10*3/uL (ref 0.0–0.1)
Basophils Relative: 1 %
Eosinophils Absolute: 0.1 10*3/uL (ref 0.0–0.5)
Eosinophils Relative: 1 %
HCT: 27.6 % — ABNORMAL LOW (ref 36.0–46.0)
Hemoglobin: 8.4 g/dL — ABNORMAL LOW (ref 12.0–15.0)
Immature Granulocytes: 0 %
Lymphocytes Relative: 33 %
Lymphs Abs: 2 10*3/uL (ref 0.7–4.0)
MCH: 20.2 pg — ABNORMAL LOW (ref 26.0–34.0)
MCHC: 30.4 g/dL (ref 30.0–36.0)
MCV: 66.3 fL — ABNORMAL LOW (ref 80.0–100.0)
Monocytes Absolute: 0.5 10*3/uL (ref 0.1–1.0)
Monocytes Relative: 8 %
Neutro Abs: 3.5 10*3/uL (ref 1.7–7.7)
Neutrophils Relative %: 57 %
Platelets: 274 10*3/uL (ref 150–400)
RBC: 4.16 MIL/uL (ref 3.87–5.11)
RDW: 20.4 % — ABNORMAL HIGH (ref 11.5–15.5)
WBC: 6.1 10*3/uL (ref 4.0–10.5)
nRBC: 0 % (ref 0.0–0.2)

## 2021-05-16 LAB — URINALYSIS, ROUTINE W REFLEX MICROSCOPIC
Bilirubin Urine: NEGATIVE
Glucose, UA: NEGATIVE mg/dL
Hgb urine dipstick: NEGATIVE
Ketones, ur: NEGATIVE mg/dL
Leukocytes,Ua: NEGATIVE
Nitrite: NEGATIVE
Protein, ur: NEGATIVE mg/dL
Specific Gravity, Urine: 1.004 — ABNORMAL LOW (ref 1.005–1.030)
pH: 6 (ref 5.0–8.0)

## 2021-05-16 LAB — COMPREHENSIVE METABOLIC PANEL
ALT: 14 U/L (ref 0–44)
AST: 15 U/L (ref 15–41)
Albumin: 3.3 g/dL — ABNORMAL LOW (ref 3.5–5.0)
Alkaline Phosphatase: 46 U/L (ref 38–126)
Anion gap: 7 (ref 5–15)
BUN: 9 mg/dL (ref 6–20)
CO2: 22 mmol/L (ref 22–32)
Calcium: 9.1 mg/dL (ref 8.9–10.3)
Chloride: 108 mmol/L (ref 98–111)
Creatinine, Ser: 0.57 mg/dL (ref 0.44–1.00)
GFR, Estimated: >60 mL/min (ref >60)
Glucose, Bld: 83 mg/dL (ref 70–99)
Potassium: 4.4 mmol/L (ref 3.5–5.1)
Sodium: 137 mmol/L (ref 135–145)
Total Bilirubin: 0.1 mg/dL — ABNORMAL LOW (ref 0.3–1.2)
Total Protein: 7.1 g/dL (ref 6.5–8.1)

## 2021-05-16 LAB — FERRITIN: Ferritin: 3 ng/mL — ABNORMAL LOW (ref 11–307)

## 2021-05-16 LAB — FOLATE: Folate: 8 ng/mL (ref >5.9)

## 2021-05-16 LAB — IRON AND TIBC
Iron: 17 ug/dL — ABNORMAL LOW (ref 28–170)
Saturation Ratios: 3 % — ABNORMAL LOW (ref 10.4–31.8)
TIBC: 510 ug/dL — ABNORMAL HIGH (ref 250–450)
UIBC: 493 ug/dL

## 2021-05-16 LAB — RETICULOCYTES
Immature Retic Fract: 23.5 % — ABNORMAL HIGH (ref 2.3–15.9)
RBC.: 4.16 MIL/uL (ref 3.87–5.11)
Retic Count, Absolute: 54.5 10*3/uL (ref 19.0–186.0)
Retic Ct Pct: 1.3 % (ref 0.4–3.1)

## 2021-05-16 LAB — VITAMIN B12: Vitamin B-12: 231 pg/mL (ref 180–914)

## 2021-05-16 MED ORDER — SODIUM CHLORIDE 0.9 % IV BOLUS
1000.0000 mL | Freq: Once | INTRAVENOUS | Status: AC
  Administered 2021-05-16: 1000 mL via INTRAVENOUS

## 2021-05-16 MED ORDER — FERUMOXYTOL INJECTION 510 MG/17 ML
510.0000 mg | Freq: Once | INTRAVENOUS | Status: AC
  Administered 2021-05-16: 510 mg via INTRAVENOUS
  Filled 2021-05-16: qty 17

## 2021-05-16 MED ORDER — IOHEXOL 350 MG/ML SOLN
80.0000 mL | Freq: Once | INTRAVENOUS | Status: AC | PRN
  Administered 2021-05-16: 80 mL via INTRAVENOUS

## 2021-05-16 MED ORDER — ALBUTEROL SULFATE HFA 108 (90 BASE) MCG/ACT IN AERS: 1.0000 | INHALATION_SPRAY | Freq: Four times a day (QID) | RESPIRATORY_TRACT | 0 refills | Status: AC | PRN

## 2021-05-16 NOTE — Progress Notes (Signed)
Pt reports feeling light headed & sick on the stomach.  HOB elevated & VS taken.

## 2021-05-16 NOTE — MAU Note (Addendum)
Presents stating she's having SOB, states started intermittently but now all day.  States SOB is worse when lying down.  States SO informed her that it sounds like she is wheezing or whistling during her sleep.  Reports Hx of asthma as child.  Also reports waking at night having night sweats. Denies VB or LOF.

## 2021-05-16 NOTE — MAU Provider Note (Signed)
History     CSN: [id number]  Arrival date and time: 05/16/21 0944   Event Date/Time   First Provider Initiated Contact with Patient 05/16/21 1047      Chief Complaint  Patient presents with   Shortness of Breath   HPI  [name] is a 36 y.o. female [phone] @ [redacted]w[redacted]d here in MAU with dizziness and shortness of breath. She reports off and on SOB since last week. She does have a history of anemia and asthma,  she was taking iron Monday, Wednesday, Friday however stopped it recently d/t constipation. She reports the SOB is worse at night when she lays down. The SOB became worrisome today while she was driving her daughter to school. This is what brought her in. She reports having to stop her vehicle d/t the SOB.  She denies pain or bleeding at this time.   OB History     Gravida  4   Para  2   Term  2   Preterm  0   AB  1   Living  2      SAB  1   IAB  0   Ectopic  0   Multiple      Live Births  2           Past Medical History:  Diagnosis Date   Anemia    Asthma    Hx of preeclampsia, prior pregnancy, currently pregnant    Last two weeks of both pregnancies, developed preeclampsia    Past Surgical History:  Procedure Laterality Date   CESAREAN SECTION     X 2, First due to preeclampsia, second just a repeat c-sect    Family History  Problem Relation Age of Onset   Healthy Mother    Diabetes Father    Kidney failure Father     Social History   Tobacco Use   Smoking status: Never   Smokeless tobacco: Never  Vaping Use   Vaping Use: Never used  Substance Use Topics   Alcohol use: No   Drug use: No    Allergies: No Known Allergies  Medications Prior to Admission  Medication Sig Dispense Refill Last Dose   ferrous sulfate 325 (65 FE) MG tablet Take 325 mg by mouth daily with breakfast.   Past Week   Results for orders placed or performed during the hospital encounter of 05/16/21 (from the past 48 hour(s))  CBC with  Differential/Platelet     Status: Abnormal   Collection Time: 05/16/21 10:30 AM  Result Value Ref Range   WBC 6.1 4.0 - 10.5 K/uL   RBC 4.16 3.87 - 5.11 MIL/uL   Hemoglobin 8.4 (L) 12.0 - 15.0 g/dL    Comment: Reticulocyte Hemoglobin testing may be clinically indicated, consider ordering this additional test [id number]    HCT 27.6 (L) 36.0 - 46.0 %   MCV 66.3 (L) 80.0 - 100.0 fL   MCH 20.2 (L) 26.0 - 34.0 pg   MCHC 30.4 30.0 - 36.0 g/dL   RDW 20.4 (H) 11.5 - 15.5 %   Platelets 274 150 - 400 K/uL    Comment: REPEATED TO VERIFY   nRBC 0.0 0.0 - 0.2 %   Neutrophils Relative % 57 %   Neutro Abs 3.5 1.7 - 7.7 K/uL   Lymphocytes Relative 33 %   Lymphs Abs 2.0 0.7 - 4.0 K/uL   Monocytes Relative 8 %   Monocytes Absolute 0.5 0.1 - 1.0 K/uL   Eosinophils Relative 1 %  Eosinophils Absolute 0.1 0.0 - 0.5 K/uL   Basophils Relative 1 %   Basophils Absolute 0.0 0.0 - 0.1 K/uL   Immature Granulocytes 0 %   Abs Immature Granulocytes 0.01 0.00 - 0.07 K/uL    Comment: Performed at [facility] [address], [place], [place] [address]  Vitamin B12     Status: None   Collection Time: 05/16/21 10:30 AM  Result Value Ref Range   Vitamin B-12 231 180 - 914 pg/mL    Comment: (NOTE) This assay is not validated for testing neonatal or myeloproliferative syndrome specimens for Vitamin B12 levels. Performed at [facility], [place] [address], [place], [place] [address]   Folate     Status: None   Collection Time: 05/16/21 10:30 AM  Result Value Ref Range   Folate 8.0 >5.9 ng/mL    Comment: Performed at [facility] [address], [place], [place] [address]  Iron and TIBC     Status: Abnormal   Collection Time: 05/16/21 10:30 AM  Result Value Ref Range   Iron 17 (L) 28 - 170 ug/dL   TIBC 510 (H) 250 - 450 ug/dL   Saturation Ratios 3 (L) 10.4 - 31.8 %   UIBC 493 ug/dL    Comment: Performed at [facility], [place] [address] [place], [place] [address]  Ferritin      Status: Abnormal   Collection Time: 05/16/21 10:30 AM  Result Value Ref Range   Ferritin 3 (L) 11 - 307 ng/mL    Comment: Performed at [facility], [place] [address] [place], [place] [address]  Reticulocytes     Status: Abnormal   Collection Time: 05/16/21 10:30 AM  Result Value Ref Range   Retic Ct Pct 1.3 0.4 - 3.1 %   RBC. 4.16 3.87 - 5.11 MIL/uL   Retic Count, Absolute 54.5 19.0 - 186.0 K/uL   Immature Retic Fract 23.5 (H) 2.3 - 15.9 %    Comment: Performed at [facility] [address], [place], [place] [address]  Comprehensive metabolic panel     Status: Abnormal   Collection Time: 05/16/21 10:30 AM  Result Value Ref Range   Sodium 137 135 - 145 mmol/L   Potassium 4.4 3.5 - 5.1 mmol/L   Chloride 108 98 - 111 mmol/L   CO2 22 22 - 32 mmol/L   Glucose, Bld 83 70 - 99 mg/dL    Comment: Glucose reference range applies only to samples taken after fasting for at least 8 hours.   BUN 9 6 - 20 mg/dL   Creatinine, Ser 0.57 0.44 - 1.00 mg/dL   Calcium 9.1 8.9 - 10.3 mg/dL   Total Protein 7.1 6.5 - 8.1 g/dL   Albumin 3.3 (L) 3.5 - 5.0 g/dL   AST 15 15 - 41 U/L   ALT 14 0 - 44 U/L   Alkaline Phosphatase 46 38 - 126 U/L   Total Bilirubin 0.1 (L) 0.3 - 1.2 mg/dL   GFR, Estimated >60 >60 mL/min    Comment: (NOTE) Calculated using the CKD-EPI Creatinine Equation (2021)    Anion gap 7 5 - 15    Comment: Performed at [facility], [place] [address], [place],  [address]  Urinalysis, Routine w reflex microscopic Urine, Clean Catch     Status: Abnormal   Collection Time: 05/16/21 10:40 AM  Result Value Ref Range   Color, Urine COLORLESS (A) YELLOW   APPearance  CLEAR CLEAR   Specific Gravity, Urine 1.004 (L) 1.005 - 1.030   pH 6.0 5.0 - 8.0   Glucose, UA NEGATIVE NEGATIVE mg/dL   Hgb urine dipstick NEGATIVE NEGATIVE   Bilirubin Urine NEGATIVE NEGATIVE   Ketones, ur NEGATIVE NEGATIVE mg/dL   Protein, ur NEGATIVE NEGATIVE mg/dL   Nitrite NEGATIVE NEGATIVE    Leukocytes,Ua NEGATIVE NEGATIVE    Comment: Performed at [facility] [address] [place], [place] [address]    CT Angio Chest Pulmonary Embolism (PE) W or WO Contrast  Result Date: 05/16/2021 CLINICAL DATA:  Chest pain/shortness of breath. First trimester pregnancy. EXAM: CT ANGIOGRAPHY CHEST WITH CONTRAST TECHNIQUE: Multidetector CT imaging of the chest was performed using the standard protocol during bolus administration of intravenous contrast. Multiplanar CT image reconstructions and MIPs were obtained to evaluate the vascular anatomy. RADIATION DOSE REDUCTION: This exam was performed according to the departmental dose-optimization program which includes automated exposure control, adjustment of the mA and/or kV according to patient size and/or use of iterative reconstruction technique. CONTRAST:  53mL OMNIPAQUE IOHEXOL 350 MG/ML SOLN COMPARISON:  None. FINDINGS: The patient is known to have a first trimester pregnancy. By report, a discussion of the risks of radiation exposure to the first trimester pregnancy balanced against the risks of undiagnosed pulmonary embolus was conducted between clinical personnel and the patient, and the patient elected to undergo CT angiography of the chest. Cardiovascular: No filling defect is identified in the pulmonary arterial tree to suggest pulmonary embolus. No acute aortic findings. Mild cardiomegaly. Mediastinum/Nodes: Unremarkable Lungs/Pleura: Unremarkable Upper Abdomen: Unremarkable Musculoskeletal: Unremarkable Review of the MIP images confirms the above findings. IMPRESSION: 1. No filling defect is identified in the pulmonary arterial tree to suggest pulmonary embolus. 2. Mild cardiomegaly. 3. The lungs appear clear. Electronically Signed   By: Van Clines M.D.   On: 05/16/2021 13:23   US OB LESS THAN 14 WEEKS WITH OB TRANSVAGINAL  Result Date: 05/16/2021 CLINICAL DATA:  Pregnancy. Inconclusive fetal viability. Estimated gestational age [redacted]  weeks 2 days by last menstrual period. EXAM: OBSTETRIC <14 WK [place] AND TRANSVAGINAL OB US TECHNIQUE: Both transabdominal and transvaginal ultrasound examinations were performed for complete evaluation of the gestation as well as the maternal uterus, adnexal regions, and pelvic cul-de-sac. Transvaginal technique was performed to assess early pregnancy. COMPARISON:  Ob ultrasound 04/14/2021 FINDINGS: Intrauterine gestational sac: Single Yolk sac:  Visualized. Embryo:  Visualized. Cardiac Activity: Not Visualized. Heart Rate: Unable to be recorded. CRL:  11.9 mm   7 w   2 d                  [place] EDC: 12/31/2021 Subchorionic hemorrhage:  None visualized. Maternal uterus/adnexae: Maternal right ovary is not visualized due to overlying bowel gas. The maternal left ovary is within normal limits. IMPRESSION: The current crown-rump length measures 11.9 mm and corresponds to an estimated gestational age of [redacted] weeks 2 days. No fetal heart rate is identified on the current study. Findings meet criteria for failed pregnancy. This follows SRU consensus guidelines: Diagnostic Criteria for Nonviable Pregnancy Early in the First Trimester. Alison Stalling J Med [phone]. Electronically Signed   By: Yvonne Kendall M.D.   On: 05/16/2021 12:23     Review of Systems  Constitutional:  Negative for fever.  Respiratory:  Positive for shortness of breath.   Cardiovascular:  Negative for chest pain.  Gastrointestinal:  Positive for constipation. Negative for abdominal pain.  Genitourinary:  Negative for vaginal bleeding.  Physical Exam   Blood pressure 109/71, pulse 79, temperature 98.1 F (36.7 C), temperature source Oral, resp. rate 18, height 5\' 7"  (1.702 m), weight 98.5 kg, last menstrual period 03/05/2021, SpO2 100 %, unknown if currently breastfeeding.  Physical Exam Constitutional:      General: She is not in acute distress.    Appearance: She is well-developed. She is not ill-appearing, toxic-appearing or diaphoretic.   Pulmonary:     Effort: Pulmonary effort is normal.  Abdominal:     Palpations: Abdomen is soft.  Musculoskeletal:        General: Normal range of motion.  Skin:    General: Skin is warm.  Neurological:     Mental Status: She is alert.    MAU Course  Procedures Pt informed that the ultrasound is considered a limited OB ultrasound and is not intended to be a complete ultrasound exam.  Patient also informed that the ultrasound is not being completed with the intent of assessing for fetal or placental anomalies or any pelvic abnormalities.  Explained that the purpose of todays ultrasound is to assess for  viability.  Patient acknowledges the purpose of the exam and the limitations of the study. Fetus noted, however unable to visualize fetal heart rate.     MDM  O positive blood type.  Orthostatic vitals with increase in HR at standing.  Ferriheme infusion given with NS bolus. Formal US ordered She was counseled on options for management--observation, medical treatment with Cytotec,  or D&E.  She elected to proceed with D&E. Patient recently had SAB and opted for Cytotec and feels this was a traumatic experience; Desires D&E     Assessment and Plan   A:  1. Shortness of breath   2. Pregnancy with inconclusive fetal viability   3. Antepartum anemia   4. Missed abortion   5. [redacted] weeks gestation of pregnancy      P:  Discharge home Reviewed patient with Dr. Harolyn Rutherford, message sent to [facility] for D&E scheduling. Informed the patient she should expect a call on Monday Return to MAU if symptoms worsen   Jerine Surles, Artist Pais, NP 05/16/2021 05/16/2021

## 2021-05-19 ENCOUNTER — Inpatient Hospital Stay (HOSPITAL_COMMUNITY)
Admission: AD | Admit: 2021-05-19 | Discharge: 2021-05-19 | Disposition: A | Discharge status: Home / Self Care | Payer: Medicaid Other | Attending: Obstetrics & Gynecology | Admitting: Obstetrics & Gynecology

## 2021-05-19 ENCOUNTER — Telehealth: Payer: Self-pay

## 2021-05-19 ENCOUNTER — Other Ambulatory Visit: Payer: Self-pay

## 2021-05-19 ENCOUNTER — Encounter (HOSPITAL_COMMUNITY): Payer: Self-pay | Admitting: Obstetrics & Gynecology

## 2021-05-19 DIAGNOSIS — D649 Anemia, unspecified: Secondary | ICD-10-CM | POA: Insufficient documentation

## 2021-05-19 DIAGNOSIS — Z3A10 10 weeks gestation of pregnancy: Secondary | ICD-10-CM

## 2021-05-19 DIAGNOSIS — R109 Unspecified abdominal pain: Secondary | ICD-10-CM | POA: Diagnosis not present

## 2021-05-19 DIAGNOSIS — O021 Missed abortion: Secondary | ICD-10-CM | POA: Diagnosis not present

## 2021-05-19 DIAGNOSIS — O034 Incomplete spontaneous abortion without complication: Secondary | ICD-10-CM | POA: Diagnosis not present

## 2021-05-19 LAB — CBC
HCT: 30.5 % — ABNORMAL LOW (ref 36.0–46.0)
Hemoglobin: 9.3 g/dL — ABNORMAL LOW (ref 12.0–15.0)
MCH: 20.3 pg — ABNORMAL LOW (ref 26.0–34.0)
MCHC: 30.5 g/dL (ref 30.0–36.0)
MCV: 66.4 fL — ABNORMAL LOW (ref 80.0–100.0)
Platelets: 281 10*3/uL (ref 150–400)
RBC: 4.59 MIL/uL (ref 3.87–5.11)
RDW: 21.1 % — ABNORMAL HIGH (ref 11.5–15.5)
WBC: 10.8 10*3/uL — ABNORMAL HIGH (ref 4.0–10.5)
nRBC: 0 % (ref 0.0–0.2)

## 2021-05-19 MED ORDER — IBUPROFEN 600 MG PO TABS: 600.0000 mg | ORAL_TABLET | Freq: Four times a day (QID) | ORAL | 0 refills | Status: DC | PRN

## 2021-05-19 MED ORDER — HYDROMORPHONE HCL 1 MG/ML IJ SOLN
1.0000 mg | Freq: Once | INTRAMUSCULAR | Status: AC
  Administered 2021-05-19: 1 mg via INTRAMUSCULAR
  Filled 2021-05-19: qty 1

## 2021-05-19 MED ORDER — KETOROLAC TROMETHAMINE 60 MG/2ML IM SOLN
60.0000 mg | Freq: Once | INTRAMUSCULAR | Status: AC
  Administered 2021-05-19: 60 mg via INTRAMUSCULAR
  Filled 2021-05-19: qty 2

## 2021-05-19 MED ORDER — OXYCODONE HCL 5 MG PO TABS: 5.0000 mg | ORAL_TABLET | Freq: Four times a day (QID) | ORAL | 0 refills | Status: DC | PRN

## 2021-05-19 NOTE — MAU Note (Signed)
PT SAYS SHE WAS HERE ON Friday  NO MEDS ON Friday  IN TRIAGE- PAD - [facility] D/C WED  IS [facility] D/C  TYLENOL XS 2 TAB AT 2PM CRAMPING TODAY NOON

## 2021-05-19 NOTE — Telephone Encounter (Signed)
Called patient, no answer, left voicemail with surgery date, time and preop instructions.

## 2021-05-19 NOTE — MAU Provider Note (Signed)
History     [id number]  Arrival date and time: 05/19/21 1902    Chief Complaint  Patient presents with   Vaginal Bleeding   Abdominal Pain     HPI Nancy Schultz is a 36 y.o. at [redacted]w[redacted]d by 6 week ultrasound with PMHx notable for anemia, who presents for abdominal pain & vaginal bleeding. Was diagnosed with missed abortion last week with fetus measuring [redacted]w[redacted]d. She is scheduled for a D&E on Wednesday. Reports significant increase in pain that started earlier today. Rates pain 10/10. Has taken tylenol without relief. Vaginal bleeding increased yesterday but not saturating pads. Has felt chills & shaky today but hasn't checked temperature at home.    OB History     Gravida  4   Para  2   Term  2   Preterm  0   AB  1   Living  2      SAB  1   IAB  0   Ectopic  0   Multiple      Live Births  2           Past Medical History:  Diagnosis Date   Anemia    Asthma    Hx of preeclampsia, prior pregnancy, currently pregnant    Last two weeks of both pregnancies, developed preeclampsia    Past Surgical History:  Procedure Laterality Date   CESAREAN SECTION     X 2, First due to preeclampsia, second just a repeat c-sect    Family History  Problem Relation Age of Onset   Healthy Mother    Diabetes Father    Kidney failure Father     No Known Allergies  No current facility-administered medications on file prior to encounter.   Current Outpatient Medications on File Prior to Encounter  Medication Sig Dispense Refill   acetaminophen (TYLENOL) 500 MG tablet Take 1,000 mg by mouth every 4 (four) hours as needed (for pain.).     ferrous sulfate 325 (65 FE) MG EC tablet Take 325 mg by mouth every Monday, Wednesday, and Friday.     albuterol (VENTOLIN HFA) 108 (90 Base) MCG/ACT inhaler Inhale 1-2 puffs into the lungs every 6 (six) hours as needed for wheezing or shortness of breath. 1 each 0     ROS Pertinent positives and negative per HPI, all others  reviewed and negative  Physical Exam   BP 123/67    Pulse 64    Temp (!) 97.4 F (36.3 C) (Oral)    Resp 20    Ht 5\' 7"  (1.702 m)    Wt 97.5 kg    LMP 03/05/2021    BMI 33.67 kg/m   Patient Vitals for the past 24 hrs:  BP Temp Temp src Pulse Resp Height Weight  05/19/21 2224 123/67 -- -- 64 -- -- --  05/19/21 1956 137/88 (!) 97.4 F (36.3 C) Oral 63 20 5\' 7"  (1.702 m) 97.5 kg    Physical Exam Vitals and nursing note reviewed. Exam conducted with a chaperone present.  Constitutional:      General: She is in acute distress.     Appearance: She is well-developed. She is not toxic-appearing.  HENT:     Head: Normocephalic and atraumatic.  Pulmonary:     Effort: Pulmonary effort is normal. No respiratory distress.  Abdominal:     Palpations: Abdomen is soft.     Tenderness: There is no abdominal tenderness.  Genitourinary:    Comments: Pelvic: small amount of dark  red blood cleared out with 2 fox swabs. Cervix appears to be dilated 0.5 cm. No tissue at os to remove.  Skin:    General: Skin is warm and dry.  Neurological:     Mental Status: She is alert.  Psychiatric:        Mood and Affect: Mood normal.        Behavior: Behavior normal.      Labs Results for orders placed or performed during the hospital encounter of 05/19/21 (from the past 24 hour(s))  CBC     Status: Abnormal   Collection Time: 05/19/21  9:19 PM  Result Value Ref Range   WBC 10.8 (H) 4.0 - 10.5 K/uL   RBC 4.59 3.87 - 5.11 MIL/uL   Hemoglobin 9.3 (L) 12.0 - 15.0 g/dL   HCT 30.5 (L) 36.0 - 46.0 %   MCV 66.4 (L) 80.0 - 100.0 fL   MCH 20.3 (L) 26.0 - 34.0 pg   MCHC 30.5 30.0 - 36.0 g/dL   RDW 21.1 (H) 11.5 - 15.5 %   Platelets 281 150 - 400 K/uL   nRBC 0.0 0.0 - 0.2 %    Imaging No results found.  MAU Course  Procedures Lab Orders         CBC     Meds ordered this encounter  Medications   ketorolac (TORADOL) injection 60 mg   HYDROmorphone (DILAUDID) injection 1 mg   ibuprofen (ADVIL) 600  MG tablet    Sig: Take 1 tablet (600 mg total) by mouth every 6 (six) hours as needed for cramping.    Dispense:  30 tablet    Refill:  0    Order Specific Question:   Supervising Provider    Answer:   Lynnda Shields A [[id number]]   oxyCODONE (ROXICODONE) 5 MG immediate release tablet    Sig: Take 1 tablet (5 mg total) by mouth every 6 (six) hours as needed for up to 3 days for breakthrough pain.    Dispense:  6 tablet    Refill:  0    Order Specific Question:   Supervising Provider    Answer:   Griffin Basil [[id number]]   Imaging Orders  No imaging studies ordered today    MDM Reviewed previous labs & imaging studies. CBC ordered & reviewed during today's visit.  Given IM toradol & dilaudid while in MAU. Prescribed oxycodone & ibuprofen for at home use  Evaluated by Dr. Elgie Congo while in MAU to determine if D&E procedure should occur today. She is stable & will keep scheduled surgery date for Wednesday  Assessment and Plan   1. Missed abortion  -rx oxycodone & ibuprofen -reviewed bleeding, pain, & infection precautions. Should return to MAU for worsening symptoms. Otherwise can keep scheduled surgery for Wednesday  2. [redacted] weeks gestation of pregnancy      Jorje Guild, NP 05/20/21 1:06 AM

## 2021-05-19 NOTE — Discharge Instructions (Signed)
Return to care  If you have heavier bleeding that soaks through more than 2 pads per hour for an hour or more If you bleed so much that you feel like you might pass out or you do pass out If you have significant abdominal pain that is not improved with Tylenol   

## 2021-05-20 ENCOUNTER — Other Ambulatory Visit: Payer: Self-pay

## 2021-05-20 ENCOUNTER — Inpatient Hospital Stay (HOSPITAL_COMMUNITY): Payer: Medicaid Other

## 2021-05-20 ENCOUNTER — Inpatient Hospital Stay (HOSPITAL_COMMUNITY)
Admission: AD | Admit: 2021-05-20 | Discharge: 2021-05-20 | Disposition: A | Discharge status: Home / Self Care | Payer: Medicaid Other | Attending: Obstetrics & Gynecology | Admitting: Obstetrics & Gynecology

## 2021-05-20 ENCOUNTER — Encounter (HOSPITAL_COMMUNITY): Payer: Self-pay | Admitting: Obstetrics and Gynecology

## 2021-05-20 ENCOUNTER — Encounter (HOSPITAL_COMMUNITY): Payer: Self-pay | Admitting: Obstetrics & Gynecology

## 2021-05-20 DIAGNOSIS — O034 Incomplete spontaneous abortion without complication: Secondary | ICD-10-CM | POA: Diagnosis not present

## 2021-05-20 DIAGNOSIS — Z3A10 10 weeks gestation of pregnancy: Secondary | ICD-10-CM

## 2021-05-20 DIAGNOSIS — O26891 Other specified pregnancy related conditions, first trimester: Secondary | ICD-10-CM | POA: Diagnosis present

## 2021-05-20 LAB — CBC
HCT: 27.6 % — ABNORMAL LOW (ref 36.0–46.0)
Hemoglobin: 8.2 g/dL — ABNORMAL LOW (ref 12.0–15.0)
MCH: 20.1 pg — ABNORMAL LOW (ref 26.0–34.0)
MCHC: 29.7 g/dL — ABNORMAL LOW (ref 30.0–36.0)
MCV: 67.6 fL — ABNORMAL LOW (ref 80.0–100.0)
Platelets: 311 10*3/uL (ref 150–400)
RBC: 4.08 MIL/uL (ref 3.87–5.11)
RDW: 21.2 % — ABNORMAL HIGH (ref 11.5–15.5)
WBC: 14.9 10*3/uL — ABNORMAL HIGH (ref 4.0–10.5)
nRBC: 0 % (ref 0.0–0.2)

## 2021-05-20 LAB — TYPE AND SCREEN
ABO/RH(D): O POS
Antibody Screen: NEGATIVE

## 2021-05-20 LAB — HCG, QUANTITATIVE, PREGNANCY: hCG, Beta Chain, Quant, S: 7828 m[IU]/mL — ABNORMAL HIGH (ref <5)

## 2021-05-20 MED ORDER — IBUPROFEN 800 MG PO TABS: 800.0000 mg | ORAL_TABLET | Freq: Three times a day (TID) | ORAL | 0 refills | Status: DC

## 2021-05-20 MED ORDER — LACTATED RINGERS IV BOLUS
1000.0000 mL | Freq: Once | INTRAVENOUS | Status: AC
Start: 2021-05-20 — End: 2021-05-20
  Administered 2021-05-20: 1000 mL via INTRAVENOUS

## 2021-05-20 MED ORDER — IBUPROFEN 800 MG PO TABS
800.0000 mg | ORAL_TABLET | Freq: Once | ORAL | Status: AC
  Administered 2021-05-20: 800 mg via ORAL
  Filled 2021-05-20: qty 1

## 2021-05-20 MED ORDER — HYDROMORPHONE HCL 1 MG/ML IJ SOLN
1.0000 mg | Freq: Once | INTRAMUSCULAR | Status: AC
  Administered 2021-05-20: 1 mg via INTRAVENOUS
  Filled 2021-05-20: qty 1

## 2021-05-20 MED ORDER — OXYCODONE HCL 5 MG PO TABS
10.0000 mg | ORAL_TABLET | Freq: Once | ORAL | Status: AC
  Administered 2021-05-20: 10 mg via ORAL
  Filled 2021-05-20: qty 2

## 2021-05-20 MED ORDER — OXYCODONE HCL 5 MG PO TABS: 10.0000 mg | ORAL_TABLET | ORAL | 0 refills | Status: DC | PRN

## 2021-05-20 NOTE — MAU Note (Signed)
Presents with c/o abdominal cramping and VB.  Reports was seen last night in MAU and told she'd miscarried.  States VB is heavier and passing clots.

## 2021-05-20 NOTE — MAU Provider Note (Signed)
History     CSN: [id number]  Arrival date and time: 05/20/21 1059   Event Date/Time   First Provider Initiated Contact with Patient 05/20/21 1123      Chief Complaint  Patient presents with   Abdominal Pain   HPI Nancy Schultz is a 36 y.o. [id number] at [redacted]w[redacted]d who presents with lower abdominal pain and vaginal bleeding. She reports the pain became unbearable this morning. She reports her pain is a 1000/10. She has tried all of the medication prescribed last night with no relief. She also reports significantly heavier bleeding today. She reports passing large clots at home.   OB History     Gravida  4   Para  2   Term  2   Preterm  0   AB  1   Living  2      SAB  1   IAB  0   Ectopic  0   Multiple      Live Births  2           Past Medical History:  Diagnosis Date   Anemia    Asthma    Hx of preeclampsia, prior pregnancy, currently pregnant    Last two weeks of both pregnancies, developed preeclampsia    Past Surgical History:  Procedure Laterality Date   CESAREAN SECTION     X 2, First due to preeclampsia, second just a repeat c-sect   DILATION AND CURETTAGE OF UTERUS  2019    Family History  Problem Relation Age of Onset   Healthy Mother    Diabetes Father    Kidney failure Father     Social History   Tobacco Use   Smoking status: Never   Smokeless tobacco: Never  Vaping Use   Vaping Use: Never used  Substance Use Topics   Alcohol use: No   Drug use: No    Allergies: No Known Allergies  Medications Prior to Admission  Medication Sig Dispense Refill Last Dose   albuterol (VENTOLIN HFA) 108 (90 Base) MCG/ACT inhaler Inhale 1-2 puffs into the lungs every 6 (six) hours as needed for wheezing or shortness of breath. 1 each 0 05/19/2021   ibuprofen (ADVIL) 600 MG tablet Take 1 tablet (600 mg total) by mouth every 6 (six) hours as needed for cramping. 30 tablet 0 05/20/2021 at 0800   oxyCODONE (ROXICODONE) 5 MG immediate release tablet  Take 1 tablet (5 mg total) by mouth every 6 (six) hours as needed for up to 3 days for breakthrough pain. 6 tablet 0 05/20/2021 at 0600   acetaminophen (TYLENOL) 500 MG tablet Take 1,000 mg by mouth every 4 (four) hours as needed (for pain.).      ferrous sulfate 325 (65 FE) MG EC tablet Take 325 mg by mouth every Monday, Wednesday, and Friday.   05/16/2021    Review of Systems  Constitutional: Negative.  Negative for fatigue and fever.  HENT: Negative.    Respiratory: Negative.  Negative for shortness of breath.   Cardiovascular: Negative.  Negative for chest pain.  Gastrointestinal:  Positive for abdominal pain. Negative for constipation, diarrhea, nausea and vomiting.  Genitourinary:  Positive for vaginal bleeding. Negative for dysuria.  Neurological: Negative.  Negative for dizziness and headaches.  Physical Exam   Blood pressure 102/88, pulse (!) 113, temperature 98 F (36.7 C), temperature source Oral, resp. rate 20, last menstrual period 03/05/2021, SpO2 100 %, unknown if currently breastfeeding.  Physical Exam Vitals and nursing note reviewed.  Constitutional:      General: She is in acute distress.     Appearance: She is well-developed.  HENT:     Head: Normocephalic.  Eyes:     Pupils: Pupils are equal, round, and reactive to light.  Cardiovascular:     Rate and Rhythm: Normal rate and regular rhythm.     Heart sounds: Normal heart sounds.  Pulmonary:     Effort: Pulmonary effort is normal. No respiratory distress.     Breath sounds: Normal breath sounds.  Abdominal:     General: Bowel sounds are normal. There is no distension.     Palpations: Abdomen is soft.     Tenderness: There is abdominal tenderness in the right lower quadrant, suprapubic area and left lower quadrant. There is guarding.  Genitourinary:    Vagina: Bleeding present.     Comments: Large amount of POC removed from os with ring forceps Skin:    General: Skin is warm and dry.  Neurological:      Mental Status: She is alert and oriented to person, place, and time.  Psychiatric:        Mood and Affect: Mood normal.        Behavior: Behavior normal.        Thought Content: Thought content normal.        Judgment: Judgment normal.    MAU Course  Procedures Results for orders placed or performed during the hospital encounter of 05/20/21 (from the past 24 hour(s))  Type and screen [facility] [facility]     Status: None   Collection Time: 05/20/21 11:20 AM  Result Value Ref Range   ABO/RH(D) O POS    Antibody Screen NEG    Sample Expiration      05/23/2021,2359 Performed at [facility] [address], [place], [place] [address]   CBC     Status: Abnormal   Collection Time: 05/20/21 11:27 AM  Result Value Ref Range   WBC 14.9 (H) 4.0 - 10.5 K/uL   RBC 4.08 3.87 - 5.11 MIL/uL   Hemoglobin 8.2 (L) 12.0 - 15.0 g/dL   HCT 27.6 (L) 36.0 - 46.0 %   MCV 67.6 (L) 80.0 - 100.0 fL   MCH 20.1 (L) 26.0 - 34.0 pg   MCHC 29.7 (L) 30.0 - 36.0 g/dL   RDW 21.2 (H) 11.5 - 15.5 %   Platelets 311 150 - 400 K/uL   nRBC 0.0 0.0 - 0.2 %  hCG, quantitative, pregnancy     Status: Abnormal   Collection Time: 05/20/21 11:27 AM  Result Value Ref Range   hCG, Beta Chain, Quant, S 7,828 (H) <5 mIU/mL    US OB Transvaginal  Result Date: 05/20/2021 CLINICAL DATA:  A 36 year old female presents for evaluation of vaginal bleeding in the setting of pregnancy. EXAM: OBSTETRIC <14 WK [place] AND TRANSVAGINAL OB US TECHNIQUE: Both transabdominal and transvaginal ultrasound examinations were performed for complete evaluation of the gestation as well as the maternal uterus, adnexal regions, and pelvic cul-de-sac. Transvaginal technique was performed to assess early pregnancy. COMPARISON:  Comparison is made with exam from May 16, 2021. FINDINGS: Intrauterine gestational sac: None Yolk sac:  Not Visualized. Embryo:  Not Visualized. Maternal uterus/adnexae: The intrauterine gestation that was seen on  the previous study is no longer visible. There is echogenic material within the lower uterine segment and cervix. Area of predominantly decreased echogenicity and anechoic appearance in communication with dilated serpiginous vasculature in the uterine fundus.  Endometrium is focally thickened in this area. Color Doppler imaging shows color Doppler flow to areas of myometrium surrounding this area but not color Doppler flow extending into the larger more cystic appearing spaces within the endometrium There is a small to moderate amount of free fluid in the pelvis that displays anechoic features. The ovaries are normal with corpus luteum associated with the RIGHT ovary. IMPRESSION: The IUP seen on the previous study is no longer visible compatible with interval spontaneous abortion. Endometrial thickening is noted on the current study and there are blood products in the lower uterine segment/cervix. Anechoic dilated spaces in the endometrium may communicate with surrounding myometrial vessels. Color Doppler shows lack of flow to most of these areas within the endometrium and some surrounding hypervascularity. Findings favor retained products of conception. It would be difficult to entirely exclude a vascular malformation in this instance and if the patient's clinical presentation suggests this possibility would suggest follow-up imaging prior to D and C. Electronically Signed   By: Zetta Bills M.D.   On: 05/20/2021 16:54     MDM Upon arrival to MAU, patient writhing and screaming in pain.   LR bolus CBC, HCG Type and Screen  Dilaudid 1mg  IV- patient reports no relief Another 1mg  dilaudid IV given- patient now reports pain is a 7/10 and consents to pelvic exam  Large amount of products of conception pulled from cervix. Small amount of bright red bleeding after and cervix 1cm  US Transvaginal After ultrasound, radiologist called back stating more images are needed  Patient's pain now a  4/10  Consulted with Dr. Elonda Husky regarding ultrasound and hemoglobin- MD reviewed ultrasound images and recommends cytotec for further management of RPOC. Recommends 834mcg now with observation in MAU and if stable, discharge home with 429mcg TID x3 days.   Discussed results and recommendations with patient. Patient states she does not want to do cytotec. She reports she had that with her last miscarriage and states the pain is too much. CNM discussed options for pain management at home and patient declines. She states she already has surgery scheduled tomorrow and would like to keep that instead. She repeated that she knows cytotec causes more cramping and bleeding and states that it might not work so she doesn't want to do it.   Radiologist called back with addendum to report stating he cannot rule out possible vascular malformation.   Dr. Elly Modena given report on patient, addendum to ultrasound and patient desire to not do cytotec and keep scheduled surgery. MD reviewed results and images and states it is ok for patient to keep scheduled surgery but strongly recommends cytotec tonight.   Reviewed recommendations with patient at length. Patient adamant that she does not want to do cytotec and will come for surgery as planned tomorrow.   PO pain medications given before discharge home as patient states she is now having 4/10 pain again.  Assessment and Plan   1. Retained products of conception after miscarriage   2. [redacted] weeks gestation of pregnancy    -Discharge home in stable condition -Rx for ibuprofen and oxy IR 10mg  tabs sent to pharmacy -Vaginal bleeding and pain precautions discussed -Patient advised to follow-up with [facility] tomorrow for surgery as scheduled  -Patient may return to MAU as needed or if her condition were to change or worsen   Wende Mott CNM 05/20/2021, 11:23 AM

## 2021-05-20 NOTE — Progress Notes (Addendum)
PCP - Denies Cardiologist - Denies EKG - 04/05/18 Chest x-ray -years ago ECHO - Denies Cardiac Cath - Denies CPAP - Denies Fasting Blood Sugar:  Denies diabetes  Anesthesia review: No  -------------  SDW INSTRUCTIONS:  Your procedure is scheduled on Wednesday February 22nd. Please report to [facility] Main Entrance "A" at [email or web address]., and check in at the Admitting office. Call this number if you have problems the morning of surgery: [phone]   Remember: Do not eat or drink anything after midnight the night before your surgery   Medications to take morning of surgery with a sip of water include: If needed Tylenol, Albuterol (please bring with you to the hospital), and oxycodone  As of today, STOP taking any Aspirin (unless otherwise instructed by your surgeon), Aleve, Naproxen, Ibuprofen, Motrin, Advil, Goody's, BC's, all herbal medications, fish oil, and all vitamins.    The Morning of Surgery Do not wear jewelry, make-up or nail polish. Do not wear lotions, powders, or perfumes or deodorant Do not bring valuables to the hospital. [facility] is not responsible for any belongings or valuables.  If you are a smoker, DO NOT Smoke 24 hours prior to surgery  If you wear a CPAP at night please bring your mask the morning of surgery   Remember that you must have someone to transport you home after your surgery, and remain with you for 24 hours if you are discharged the same day.  Please bring cases for contacts, glasses, hearing aids, dentures or bridgework because it cannot be worn into surgery.   Patients discharged the day of surgery will not be allowed to drive home.   Please shower the NIGHT BEFORE/MORNING OF SURGERY (use antibacterial soap like DIAL soap if possible). Wear comfortable clothes the morning of surgery. Oral Hygiene is also important to reduce your risk of infection.  Remember - BRUSH YOUR TEETH THE MORNING OF SURGERY WITH YOUR REGULAR TOOTHPASTE  Patient  denies shortness of breath, fever, cough and chest pain. Patient was throwing up before our call but stated she would proceed with call.  Told her if symptoms did not subside to reach out to her surgeon to make them aware.

## 2021-05-21 ENCOUNTER — Encounter (HOSPITAL_COMMUNITY): Payer: Self-pay | Admitting: Obstetrics and Gynecology

## 2021-05-21 ENCOUNTER — Encounter (HOSPITAL_COMMUNITY)
Admission: RE | Disposition: A | Discharge status: Home / Self Care | Payer: Self-pay | Source: Home / Self Care | Attending: Obstetrics and Gynecology

## 2021-05-21 ENCOUNTER — Encounter (HOSPITAL_COMMUNITY): Payer: Self-pay | Admitting: Anesthesiology

## 2021-05-21 ENCOUNTER — Ambulatory Visit (HOSPITAL_COMMUNITY)
Admission: RE | Admit: 2021-05-21 | Discharge: 2021-05-21 | Disposition: A | Discharge status: Home / Self Care | Payer: Medicaid Other | Attending: Obstetrics and Gynecology | Admitting: Obstetrics and Gynecology

## 2021-05-21 ENCOUNTER — Other Ambulatory Visit: Payer: Self-pay

## 2021-05-21 ENCOUNTER — Other Ambulatory Visit (INDEPENDENT_AMBULATORY_CARE_PROVIDER_SITE_OTHER): Payer: Medicaid Other | Admitting: Obstetrics and Gynecology

## 2021-05-21 DIAGNOSIS — Z538 Procedure and treatment not carried out for other reasons: Secondary | ICD-10-CM | POA: Insufficient documentation

## 2021-05-21 DIAGNOSIS — O021 Missed abortion: Secondary | ICD-10-CM

## 2021-05-21 DIAGNOSIS — O039 Complete or unspecified spontaneous abortion without complication: Secondary | ICD-10-CM

## 2021-05-21 SURGERY — DILATION AND EVACUATION, UTERUS
Anesthesia: Choice

## 2021-05-21 MED ORDER — SOD CITRATE-CITRIC ACID 500-334 MG/5ML PO SOLN
30.0000 mL | ORAL | Status: AC
  Administered 2021-05-21: 30 mL via ORAL
  Filled 2021-05-21: qty 30

## 2021-05-21 MED ORDER — FENTANYL CITRATE (PF) 250 MCG/5ML IJ SOLN
INTRAMUSCULAR | Status: AC
  Filled 2021-05-21: qty 5

## 2021-05-21 MED ORDER — CHLORHEXIDINE GLUCONATE 0.12 % MT SOLN
15.0000 mL | Freq: Once | OROMUCOSAL | Status: AC
  Administered 2021-05-21: 15 mL via OROMUCOSAL
  Filled 2021-05-21: qty 15

## 2021-05-21 MED ORDER — METHYLERGONOVINE MALEATE 0.2 MG PO TABS: 0.2000 mg | ORAL_TABLET | Freq: Three times a day (TID) | ORAL | 0 refills | Status: DC

## 2021-05-21 MED ORDER — LACTATED RINGERS IV SOLN: INTRAVENOUS | Status: DC

## 2021-05-21 MED ORDER — PROPOFOL 10 MG/ML IV BOLUS
INTRAVENOUS | Status: AC
  Filled 2021-05-21: qty 20

## 2021-05-21 MED ORDER — SCOPOLAMINE 1 MG/3DAYS TD PT72
1.0000 | MEDICATED_PATCH | TRANSDERMAL | Status: DC
  Administered 2021-05-21: 1.5 mg via TRANSDERMAL
  Filled 2021-05-21: qty 1

## 2021-05-21 MED ORDER — CEFAZOLIN SODIUM-DEXTROSE 2-4 GM/100ML-% IV SOLN
2.0000 g | INTRAVENOUS | Status: DC
  Filled 2021-05-21: qty 100

## 2021-05-21 MED ORDER — ORAL CARE MOUTH RINSE: 15.0000 mL | Freq: Once | OROMUCOSAL | Status: AC

## 2021-05-21 MED ORDER — POVIDONE-IODINE 10 % EX SWAB
2.0000 "application " | Freq: Once | CUTANEOUS | Status: AC
  Administered 2021-05-21: 2 via TOPICAL

## 2021-05-21 MED ORDER — ACETAMINOPHEN 500 MG PO TABS: 1000.0000 mg | ORAL_TABLET | ORAL | Status: DC

## 2021-05-21 MED ORDER — ACETAMINOPHEN 500 MG PO TABS
1000.0000 mg | ORAL_TABLET | Freq: Once | ORAL | Status: AC
  Administered 2021-05-21: 1000 mg via ORAL
  Filled 2021-05-21: qty 2

## 2021-05-21 NOTE — Progress Notes (Signed)
Pt seen shortly before her scheduled D and C.  Chart and ultrasound were reviewed.  It appeared that the majority of POC were removed in the MAU.  Ultrasound performed directly after POC removed showed an overall clean endometrial stripe.  Some blood and clot  was noted in the lower uterine segment; however, a possible vascular malformation could not be ruled out.  The pt was offered cytotec in the MAU to clear out any possible remnants.  Pt refused the cytotec due to a bad experience with the medication in the past.  Decision was made to cancel the case and to pursue expectant management.  Pt did accept methergine.  She was advised to follow up in the office in 2 weeks for bhcg and further evaluation.  A message was sent and also verbally discussed with the [facility] scheduling staff.  All findings were relayed to the patient and her spouse.  They have agreed with the plan.  CLINICAL DATA:  A 36 year old female presents for evaluation of vaginal bleeding in the setting of pregnancy.   EXAM: OBSTETRIC <14 WK [place] AND TRANSVAGINAL OB US   TECHNIQUE: Both transabdominal and transvaginal ultrasound examinations were performed for complete evaluation of the gestation as well as the maternal uterus, adnexal regions, and pelvic cul-de-sac. Transvaginal technique was performed to assess early pregnancy.   COMPARISON:  Comparison is made with exam from May 16, 2021.   FINDINGS: Intrauterine gestational sac: None   Yolk sac:  Not Visualized.   Embryo:  Not Visualized.   Maternal uterus/adnexae: The intrauterine gestation that was seen on the previous study is no longer visible. There is echogenic material within the lower uterine segment and cervix. Area of predominantly decreased echogenicity and anechoic appearance in communication with dilated serpiginous vasculature in the uterine fundus. Endometrium is focally thickened in this area.   Color Doppler imaging shows color Doppler flow to areas  of myometrium surrounding this area but not color Doppler flow extending into the larger more cystic appearing spaces within the endometrium   There is a small to moderate amount of free fluid in the pelvis that displays anechoic features.   The ovaries are normal with corpus luteum associated with the RIGHT ovary.   IMPRESSION: The IUP seen on the previous study is no longer visible compatible with interval spontaneous abortion. Endometrial thickening is noted on the current study and there are blood products in the lower uterine segment/cervix.   Anechoic dilated spaces in the endometrium may communicate with surrounding myometrial vessels. Color Doppler shows lack of flow to most of these areas within the endometrium and some surrounding hypervascularity. Findings favor retained products of conception. It would be difficult to entirely exclude a vascular malformation in this instance and if the patient's clinical presentation suggests this possibility would suggest follow-up imaging prior to D and C.     Lynnda Shields, MD

## 2021-05-21 NOTE — Anesthesia Preprocedure Evaluation (Deleted)
Anesthesia Evaluation    Reviewed: Allergy & Precautions, Patient's Chart, lab work & pertinent test results  Airway        Dental   Pulmonary asthma ,           Cardiovascular negative cardio ROS       Neuro/Psych negative neurological ROS  negative psych ROS   GI/Hepatic negative GI ROS, Neg liver ROS,   Endo/Other  Obesity BMI 34  Renal/GU negative Renal ROS  negative genitourinary   Musculoskeletal   Abdominal   Peds  Hematology  (+) Blood dyscrasia, anemia , Hb 8.2, plt 311   Anesthesia Other Findings   Reproductive/Obstetrics Missed ab 11 weeks                             Anesthesia Physical Anesthesia Plan  ASA: 2  Anesthesia Plan: General   Post-op Pain Management: Tylenol PO (pre-op)* and Toradol IV (intra-op)*   Induction: Intravenous  PONV Risk Score and Plan: 4 or greater and Ondansetron, Dexamethasone, Midazolam, Scopolamine patch - Pre-op and Treatment may vary due to age or medical condition  Airway Management Planned: LMA  Additional Equipment: None  Intra-op Plan:   Post-operative Plan: Extubation in OR  Informed Consent:   Plan Discussed with:   Anesthesia Plan Comments:         Anesthesia Quick Evaluation

## 2021-05-21 NOTE — Progress Notes (Signed)
Per Dr. Lynnda Shields, patient will not have surgery today. OR was notified. At this time patient denies any discomfort. Patient was instructed to go to the pharmacy and pick up the medicine prescribed by Dr. Elgie Congo. Patient verbalized understanding. Patient accompanied by her significant other left the facility.

## 2021-05-22 LAB — SURGICAL PATHOLOGY

## 2021-06-05 ENCOUNTER — Encounter: Payer: Medicaid Other | Admitting: Obstetrics and Gynecology

## 2021-08-30 ENCOUNTER — Encounter (HOSPITAL_BASED_OUTPATIENT_CLINIC_OR_DEPARTMENT_OTHER): Payer: Self-pay | Admitting: Emergency Medicine

## 2021-08-30 ENCOUNTER — Other Ambulatory Visit: Payer: Self-pay

## 2021-08-30 DIAGNOSIS — Z5321 Procedure and treatment not carried out due to patient leaving prior to being seen by health care provider: Secondary | ICD-10-CM | POA: Insufficient documentation

## 2021-08-30 DIAGNOSIS — G43909 Migraine, unspecified, not intractable, without status migrainosus: Secondary | ICD-10-CM | POA: Diagnosis not present

## 2021-08-30 NOTE — ED Triage Notes (Signed)
Pt c/o migraine x 3 days. Pt states that she thinks its related to her iron being low.

## 2021-08-31 ENCOUNTER — Emergency Department (HOSPITAL_BASED_OUTPATIENT_CLINIC_OR_DEPARTMENT_OTHER)
Admission: EM | Admit: 2021-08-31 | Discharge: 2021-08-31 | Disposition: A | Discharge status: Home / Self Care | Payer: Medicaid Other | Attending: Emergency Medicine | Admitting: Emergency Medicine

## 2021-08-31 ENCOUNTER — Ambulatory Visit
Admission: RE | Admit: 2021-08-31 | Discharge: 2021-08-31 | Disposition: A | Discharge status: Home / Self Care | Payer: Medicaid Other | Source: Ambulatory Visit | Attending: Emergency Medicine | Admitting: Emergency Medicine

## 2021-08-31 VITALS — BP 103/71 | HR 82 | Temp 98.5°F | Resp 20

## 2021-08-31 DIAGNOSIS — G43011 Migraine without aura, intractable, with status migrainosus: Secondary | ICD-10-CM | POA: Diagnosis not present

## 2021-08-31 MED ORDER — METOCLOPRAMIDE HCL 5 MG/ML IJ SOLN
10.0000 mg | Freq: Once | INTRAMUSCULAR | Status: AC
  Administered 2021-08-31: 10 mg via INTRAMUSCULAR

## 2021-08-31 MED ORDER — DOXYCYCLINE HYCLATE 100 MG PO CAPS
100.0000 mg | ORAL_CAPSULE | Freq: Two times a day (BID) | ORAL | 0 refills | Status: AC
Start: 2021-08-31 — End: 2021-09-10

## 2021-08-31 MED ORDER — KETOROLAC TROMETHAMINE 30 MG/ML IJ SOLN
30.0000 mg | Freq: Once | INTRAMUSCULAR | Status: AC
Start: 2021-08-31 — End: 2021-08-31
  Administered 2021-08-31: 30 mg via INTRAMUSCULAR

## 2021-08-31 NOTE — ED Provider Notes (Signed)
[facility]    CSN: [id number] Arrival date & time: 08/31/21  1322    HISTORY   Chief Complaint  Patient presents with   Migraine   HPI Nancy Schultz is a 36 y.o. female. Patient presents urgent care today complaining of having a migraine behind her left eye for the past 4 days.  Patient states she has been nauseated but is not at this time.  Patient states that her pain significantly worsened last night she attempted to go to the med center in [facility] however the line was too long so she went home and just toughed it out.  Patient states she has been taking ibuprofen 800 mg, she is taken Tylenol 1 g and Excedrin 2 tablets throughout the course of this headache and none of them have been particularly effective.  Patient states she had migraines in the past but never lasted this long.  As an aside, patient states she recently had an eye exam and her eye doctor told her that there was some scar tissue behind her left eye, states despite having multiple more specific test done there is no known cause identified.  Patient states that she has pain when she attempts to squeeze her eyelids closed.  Patient also has noticed that her distance vision in her left eye is a little blurrier than normal but her up close vision is fine.  The history is provided by the patient.  Past Medical History:  Diagnosis Date   Anemia    Asthma    Hx of preeclampsia, prior pregnancy, currently pregnant    Last two weeks of both pregnancies, developed preeclampsia   Patient Active Problem List   Diagnosis Date Noted   Olecranon bursitis of right elbow 03/19/2021   Past Surgical History:  Procedure Laterality Date   CESAREAN SECTION     X 2, First due to preeclampsia, second just a repeat c-sect   DILATION AND CURETTAGE OF UTERUS  2019   OB History     Gravida  4   Para  2   Term  2   Preterm  0   AB  1   Living  2      SAB  1   IAB  0   Ectopic  0   Multiple       Live Births  2          Home Medications    Prior to Admission medications   Medication Sig Start Date End Date Taking? Authorizing Provider  albuterol (VENTOLIN HFA) 108 (90 Base) MCG/ACT inhaler Inhale 1-2 puffs into the lungs every 6 (six) hours as needed for wheezing or shortness of breath. 05/16/21   Rasch, Anderson Malta I, NP  ferrous sulfate 325 (65 FE) MG EC tablet Take 325 mg by mouth every Monday, Wednesday, and Friday. 02/18/21   [provider]  ibuprofen (ADVIL) 800 MG tablet Take 1 tablet (800 mg total) by mouth 3 (three) times daily. 05/20/21   Wende Mott, CNM    Family History Family History  Problem Relation Age of Onset   Healthy Mother    Diabetes Father    Kidney failure Father    Social History Social History   Tobacco Use   Smoking status: Never   Smokeless tobacco: Never  Vaping Use   Vaping Use: Never used  Substance Use Topics   Alcohol use: No   Drug use: No   Allergies   Patient has no known allergies.  Review of Systems Review of Systems Pertinent findings noted in history of present illness.   Physical Exam Triage Vital Signs ED Triage Vitals  Enc Vitals Group     BP 01/24/21 0827 (!) 147/82     Pulse Rate 01/24/21 0827 72     Resp 01/24/21 0827 18     Temp 01/24/21 0827 98.3 F (36.8 C)     Temp Source 01/24/21 0827 Oral     SpO2 01/24/21 0827 98 %     Weight --      Height --      Head Circumference --      Peak Flow --      Pain Score 01/24/21 0826 5     Pain Loc --      Pain Edu? --      Excl. in [place]? --   No data found.  Updated Vital Signs BP 103/71   Pulse 82   Temp 98.5 F (36.9 C)   Resp 20   LMP 08/16/2021   SpO2 98%   Breastfeeding No   Physical Exam Vitals and nursing note reviewed.  Constitutional:      General: She is not in acute distress.    Appearance: Normal appearance. She is normal weight. She is not ill-appearing, toxic-appearing or diaphoretic.  HENT:     Head: Normocephalic and  atraumatic.     Salivary Glands: Right salivary gland is not diffusely enlarged or tender. Left salivary gland is not diffusely enlarged or tender.     Right Ear: Tympanic membrane, ear canal and external ear normal. No drainage. No middle ear effusion. There is no impacted cerumen. Tympanic membrane is not erythematous or bulging.     Left Ear: Tympanic membrane, ear canal and external ear normal. No drainage.  No middle ear effusion. There is no impacted cerumen. Tympanic membrane is not erythematous or bulging.     Nose: Nose normal. No nasal deformity, septal deviation, mucosal edema, congestion or rhinorrhea.     Right Turbinates: Not enlarged, swollen or pale.     Left Turbinates: Not enlarged, swollen or pale.     Right Sinus: No maxillary sinus tenderness or frontal sinus tenderness.     Left Sinus: No maxillary sinus tenderness or frontal sinus tenderness.     Mouth/Throat:     Lips: Pink. No lesions.     Mouth: Mucous membranes are moist. No oral lesions.     Pharynx: Oropharynx is clear. Uvula midline. No posterior oropharyngeal erythema or uvula swelling.     Tonsils: No tonsillar exudate. 0 on the right. 0 on the left.  Eyes:     General: Lids are normal. Lids are everted, no foreign bodies appreciated. Vision grossly intact. Gaze aligned appropriately. No allergic shiner, visual field deficit or scleral icterus.       Right eye: No foreign body, discharge or hordeolum.        Left eye: No foreign body, discharge or hordeolum.     Extraocular Movements: Extraocular movements intact.     Right eye: Normal extraocular motion and no nystagmus.     Left eye: Normal extraocular motion and no nystagmus.     Conjunctiva/sclera: Conjunctivae normal.     Right eye: Right conjunctiva is not injected. No chemosis, exudate or hemorrhage.    Left eye: Left conjunctiva is not injected. No chemosis, exudate or hemorrhage.    Pupils: Pupils are equal, round, and reactive to light. Pupils are  equal.  Right eye: Pupil is round, reactive and not sluggish.     Left eye: Pupil is round, reactive and not sluggish.  Neck:     Trachea: Trachea and phonation normal.  Cardiovascular:     Rate and Rhythm: Normal rate and regular rhythm.     Pulses: Normal pulses.     Heart sounds: Normal heart sounds. No murmur heard.   No friction rub. No gallop.  Pulmonary:     Effort: Pulmonary effort is normal. No accessory muscle usage, prolonged expiration or respiratory distress.     Breath sounds: Normal breath sounds. No stridor, decreased air movement or transmitted upper airway sounds. No decreased breath sounds, wheezing, rhonchi or rales.  Chest:     Chest wall: No tenderness.  Musculoskeletal:        General: Normal range of motion.     Cervical back: Normal range of motion and neck supple. Normal range of motion.  Lymphadenopathy:     Cervical: No cervical adenopathy.  Skin:    General: Skin is warm and dry.     Findings: No erythema or rash.  Neurological:     General: No focal deficit present.     Mental Status: She is alert and oriented to person, place, and time. Mental status is at baseline.     Cranial Nerves: Cranial nerves 2-12 are intact. No cranial nerve deficit, dysarthria or facial asymmetry.     Sensory: Sensation is intact. No sensory deficit.     Motor: Motor function is intact.     Coordination: Coordination is intact.  Psychiatric:        Mood and Affect: Mood normal.        Behavior: Behavior normal.    Visual Acuity Right Eye Distance: 20/13 Left Eye Distance: 20/50 Bilateral Distance: 20/10  Right Eye Near:   Left Eye Near:    Bilateral Near:     UC Couse / Diagnostics / Procedures:    EKG  Radiology No results found.  Procedures Procedures (including critical care time)  UC Diagnoses / Final Clinical Impressions(s)   I have reviewed the triage vital signs and the nursing notes.  Pertinent labs & imaging results that were available  during my care of the patient were reviewed by me and considered in my medical decision making (see chart for details).    Final diagnoses:  Intractable migraine without aura and with status migrainosus   Patient was provided with an injection of ketorolac and Reglan for presumed migraine during her visit today.  Patient also further advised that given the suspicious scarring between behind her left eye, diminished vision in her left eye that if the ketorolac does not completely relieve her pain that I recommend that she begin taking doxycycline for presumed preseptal cellulitis.  Patient further advised that she continues to have eye pain despite doxycycline for 24 hours and a ketorolac injection that she should go to the emergency room for more acute evaluation.  Patient verbalized understanding and agreement with plan.  ED Prescriptions     Medication Sig Dispense Auth. Provider   doxycycline (VIBRAMYCIN) 100 MG capsule Take 1 capsule (100 mg total) by mouth 2 (two) times daily for 10 days. 20 capsule Lynden Oxford Scales, PA-C      I have reviewed the PDMP during this encounter.  Pending results:  Labs Reviewed - No data to display  Medications Ordered in UC: Medications  ketorolac (TORADOL) 30 MG/ML injection 30 mg (30 mg Intramuscular Given 08/31/21  1418)  metoCLOPramide (REGLAN) injection 10 mg (10 mg Intramuscular Given 08/31/21 1418)    Disposition Upon Discharge:  Condition: stable for discharge home Home: take medications as prescribed; routine discharge instructions as discussed; follow up as advised.  Patient presented with an acute illness with associated systemic symptoms and significant discomfort requiring urgent management. In my opinion, this is a condition that a prudent lay person (someone who possesses an average knowledge of health and medicine) may potentially expect to result in complications if not addressed urgently such as respiratory distress, impairment of  bodily function or dysfunction of bodily organs.   Routine symptom specific, illness specific and/or disease specific instructions were discussed with the patient and/or caregiver at length.   As such, the patient has been evaluated and assessed, work-up was performed and treatment was provided in alignment with urgent care protocols and evidence based medicine.  Patient/parent/caregiver has been advised that the patient may require follow up for further testing and treatment if the symptoms continue in spite of treatment, as clinically indicated and appropriate.  Patient/parent/caregiver has been advised to return to the [facility] or PCP if no better; to PCP or the Emergency Department if new signs and symptoms develop, or if the current signs or symptoms continue to change or worsen for further workup, evaluation and treatment as clinically indicated and appropriate  The patient will follow up with their current PCP if and as advised. If the patient does not currently have a PCP we will assist them in obtaining one.   The patient may need specialty follow up if the symptoms continue, in spite of conservative treatment and management, for further workup, evaluation, consultation and treatment as clinically indicated and appropriate.   Patient/parent/caregiver verbalized understanding and agreement of plan as discussed.  All questions were addressed during visit.  Please see discharge instructions below for further details of plan.  Discharge Instructions:   Discharge Instructions      To address your migraine and hopefully resolve it completely, you are provided with an injection of ketorolac and Reglan during your visit today.  You definitely had some visual deficits in your left eye when your distance vision was checked.  I recommend that you follow-up with your ophthalmologist as soon as possible about this abnormal finding, particularly since she does have a normal eye exam a few months  ago.  As we discussed, if you do not have complete resolution of the headache after an hour or the headache goes away and completely returns in 6 to 8 hours, please consider picking up the prescription for doxycycline that sent to your pharmacy today.  You will take 1 dose twice daily for 10 days.  Remember, if this truly is a migraine because it resolves with the 2 injections that you do not need take this medication.  Thank you for visiting urgent care today.      This office note has been dictated using Museum/gallery curator.  Unfortunately, and despite my best efforts, this method of dictation can sometimes lead to occasional typographical or grammatical errors.  I apologize in advance if this occurs.     Lynden Oxford Scales, PA-C 08/31/21 1940

## 2021-08-31 NOTE — ED Triage Notes (Signed)
Pt here with migraine behind left eye x 4 days. Denies nausea currently.

## 2021-08-31 NOTE — Discharge Instructions (Addendum)
To address your migraine and hopefully resolve it completely, you are provided with an injection of ketorolac and Reglan during your visit today.  You definitely had some visual deficits in your left eye when your distance vision was checked.  I recommend that you follow-up with your ophthalmologist as soon as possible about this abnormal finding, particularly since she does have a normal eye exam a few months ago.  As we discussed, if you do not have complete resolution of the headache after an hour or the headache goes away and completely returns in 6 to 8 hours, please consider picking up the prescription for doxycycline that sent to your pharmacy today.  You will take 1 dose twice daily for 10 days.  Remember, if this truly is a migraine because it resolves with the 2 injections that you do not need take this medication.  Thank you for visiting urgent care today.

## 2022-07-13 ENCOUNTER — Encounter: Payer: Self-pay | Admitting: *Deleted

## 2022-09-18 ENCOUNTER — Other Ambulatory Visit: Payer: Self-pay

## 2022-09-18 ENCOUNTER — Emergency Department (HOSPITAL_BASED_OUTPATIENT_CLINIC_OR_DEPARTMENT_OTHER): Payer: Medicaid Other

## 2022-09-18 ENCOUNTER — Encounter (HOSPITAL_BASED_OUTPATIENT_CLINIC_OR_DEPARTMENT_OTHER): Payer: Self-pay | Admitting: Emergency Medicine

## 2022-09-18 ENCOUNTER — Emergency Department (HOSPITAL_BASED_OUTPATIENT_CLINIC_OR_DEPARTMENT_OTHER)
Admission: EM | Admit: 2022-09-18 | Discharge: 2022-09-18 | Disposition: A | Discharge status: Home / Self Care | Payer: Medicaid Other | Attending: Emergency Medicine | Admitting: Emergency Medicine

## 2022-09-18 DIAGNOSIS — J45909 Unspecified asthma, uncomplicated: Secondary | ICD-10-CM | POA: Insufficient documentation

## 2022-09-18 DIAGNOSIS — F17200 Nicotine dependence, unspecified, uncomplicated: Secondary | ICD-10-CM | POA: Insufficient documentation

## 2022-09-18 DIAGNOSIS — R0789 Other chest pain: Secondary | ICD-10-CM | POA: Diagnosis not present

## 2022-09-18 DIAGNOSIS — R079 Chest pain, unspecified: Secondary | ICD-10-CM | POA: Diagnosis present

## 2022-09-18 LAB — BASIC METABOLIC PANEL
Anion gap: 9 (ref 5–15)
BUN: 10 mg/dL (ref 6–20)
CO2: 24 mmol/L (ref 22–32)
Calcium: 9.6 mg/dL (ref 8.9–10.3)
Chloride: 104 mmol/L (ref 98–111)
Creatinine, Ser: 0.62 mg/dL (ref 0.44–1.00)
GFR, Estimated: >60 mL/min (ref >60)
Glucose, Bld: 83 mg/dL (ref 70–99)
Potassium: 4.1 mmol/L (ref 3.5–5.1)
Sodium: 137 mmol/L (ref 135–145)

## 2022-09-18 LAB — CBC
HCT: 35.8 % — ABNORMAL LOW (ref 36.0–46.0)
Hemoglobin: 10.9 g/dL — ABNORMAL LOW (ref 12.0–15.0)
MCH: 22.3 pg — ABNORMAL LOW (ref 26.0–34.0)
MCHC: 30.4 g/dL (ref 30.0–36.0)
MCV: 73.2 fL — ABNORMAL LOW (ref 80.0–100.0)
Platelets: 361 10*3/uL (ref 150–400)
RBC: 4.89 MIL/uL (ref 3.87–5.11)
RDW: 18.1 % — ABNORMAL HIGH (ref 11.5–15.5)
WBC: 5 10*3/uL (ref 4.0–10.5)
nRBC: 0 % (ref 0.0–0.2)

## 2022-09-18 LAB — TROPONIN I (HIGH SENSITIVITY): Troponin I (High Sensitivity): <2 ng/L (ref <18)

## 2022-09-18 LAB — D-DIMER, QUANTITATIVE: D-Dimer, Quant: 0.7 ug/mL-FEU — ABNORMAL HIGH (ref 0.00–0.50)

## 2022-09-18 MED ORDER — IBUPROFEN 600 MG PO TABS: 600.0000 mg | ORAL_TABLET | Freq: Four times a day (QID) | ORAL | 0 refills | Status: AC | PRN

## 2022-09-18 MED ORDER — IOHEXOL 350 MG/ML SOLN
100.0000 mL | Freq: Once | INTRAVENOUS | Status: AC | PRN
  Administered 2022-09-18: 100 mL via INTRAVENOUS

## 2022-09-18 MED ORDER — KETOROLAC TROMETHAMINE 15 MG/ML IJ SOLN
15.0000 mg | Freq: Once | INTRAMUSCULAR | Status: AC
  Administered 2022-09-18: 15 mg via INTRAVENOUS
  Filled 2022-09-18: qty 1

## 2022-09-18 NOTE — ED Provider Notes (Signed)
[place] [facility] Provider Note   CSN: [id number] Arrival date & time: 09/18/22  1416     History  Chief Complaint  Patient presents with   Chest Pain    Nancy Schultz is a 37 y.o. female.   Chest Pain   37 year old female presents emergency department with complaints of chest pain.  Patient states that chest pain began upon awakening this morning.  Reports central chest pain described as pressure worsened with certain positions of her upper extremities as well as with taking a deep breath.  Denies any known trauma to affected area.  States she feels mildly short of breath when pain is worse.  Denies any worsening with physical activity/exertion.  Denies any history of DVT/PE, recent surgery/immobilization, known coagulopathy, known malignancy, hormone use.  Denies any fever, cough, abdominal pain, nausea, vomiting, urinary symptoms, change in bowel habits.  States she has had similar chest pain in the past but has not lasted this long prompting visit to emergency department.  Past medical history significant for asthma  Home Medications Prior to Admission medications   Medication Sig Start Date End Date Taking? Authorizing Provider  ibuprofen (ADVIL) 600 MG tablet Take 1 tablet (600 mg total) by mouth every 6 (six) hours as needed. 09/18/22  Yes Sherian Maroon A, PA  albuterol (VENTOLIN HFA) 108 (90 Base) MCG/ACT inhaler Inhale 1-2 puffs into the lungs every 6 (six) hours as needed for wheezing or shortness of breath. 05/16/21   Rasch, Victorino Dike I, NP  ferrous sulfate 325 (65 FE) MG EC tablet Take 325 mg by mouth every Monday, Wednesday, and Friday. 02/18/21   [provider]      Allergies    Patient has no known allergies.    Review of Systems   Review of Systems  Cardiovascular:  Positive for chest pain.  All other systems reviewed and are negative.   Physical Exam Updated Vital Signs BP (!) 120/95 (BP Location: Left  Arm)   Pulse 96   Temp 98 F (36.7 C) (Oral)   Resp 14   Ht 5\' 6"  (1.676 m)   Wt 98.9 kg   LMP 09/11/2022 (Exact Date)   SpO2 100%   BMI 35.19 kg/m  Physical Exam Vitals and nursing note reviewed.  Constitutional:      General: She is not in acute distress.    Appearance: She is well-developed.  HENT:     Head: Normocephalic and atraumatic.  Eyes:     Conjunctiva/sclera: Conjunctivae normal.  Cardiovascular:     Rate and Rhythm: Normal rate and regular rhythm.     Heart sounds: No murmur heard. Pulmonary:     Effort: Pulmonary effort is normal. No respiratory distress.     Breath sounds: Normal breath sounds. No wheezing, rhonchi or rales.     Comments: Sternal tenderness to palpation. Chest:     Chest wall: Tenderness present.  Abdominal:     Palpations: Abdomen is soft.     Tenderness: There is no abdominal tenderness.  Musculoskeletal:        General: No swelling.     Cervical back: Neck supple.     Right lower leg: No edema.     Left lower leg: No edema.  Skin:    General: Skin is warm and dry.     Capillary Refill: Capillary refill takes less than 2 seconds.  Neurological:     Mental Status: She is alert.  Psychiatric:  Mood and Affect: Mood normal.     ED Results / Procedures / Treatments   Labs (all labs ordered are listed, but only abnormal results are displayed) Labs Reviewed  CBC - Abnormal; Notable for the following components:      Result Value   Hemoglobin 10.9 (*)    HCT 35.8 (*)    MCV 73.2 (*)    MCH 22.3 (*)    RDW 18.1 (*)    All other components within normal limits  D-DIMER, QUANTITATIVE - Abnormal; Notable for the following components:   D-Dimer, Quant 0.70 (*)    All other components within normal limits  BASIC METABOLIC PANEL  PREGNANCY, URINE  TROPONIN I (HIGH SENSITIVITY)  TROPONIN I (HIGH SENSITIVITY)    EKG None  Radiology CT Angio Chest PE W/Cm &/Or Wo Cm  Result Date: 09/18/2022 CLINICAL DATA:  Chest pain  which worsens with inspiration. Some shortness of breath EXAM: CT ANGIOGRAPHY CHEST WITH CONTRAST TECHNIQUE: Multidetector CT imaging of the chest was performed using the standard protocol during bolus administration of intravenous contrast. Multiplanar CT image reconstructions and MIPs were obtained to evaluate the vascular anatomy. RADIATION DOSE REDUCTION: This exam was performed according to the departmental dose-optimization program which includes automated exposure control, adjustment of the mA and/or kV according to patient size and/or use of iterative reconstruction technique. CONTRAST:  OMNIPAQUE IOHEXOL 350 MG/ML SOLN COMPARISON:  X-ray earlier 09/18/2022.  CT 05/16/2021 FINDINGS: Cardiovascular: Heart is nonenlarged. No pericardial effusion. The thoracic aorta has a normal course and caliber. There is a left arm injection with streak artifact along the upper mediastinum from the dense contrast in the venous system. There is breathing motion identified which limits evaluation for pulmonary emboli, nondiagnostic for small and peripheral emboli. No segmental or larger pulmonary embolism identified. Mediastinum/Nodes: Preserved thyroid gland. Preserved course and caliber of the esophagus. No specific abnormal lymph node enlargement seen in the axillary region, hilum or mediastinum. Lungs/Pleura: No consolidation, pneumothorax or effusion. Breathing motion. 4 mm subpleural nodule lateral right lung on series 5, image 43. No follow-up needed if patient is low-risk.This recommendation follows the consensus statement: Guidelines for Management of Incidental Pulmonary Nodules Detected on CT Images: From the Fleischner Society 2017; Radiology 2017; 284:228-243. Upper Abdomen: No acute abnormality. Musculoskeletal: Slight curvature of the spine. Review of the MIP images confirms the above findings. IMPRESSION: Breathing motion.  No segmental or larger pulmonary embolism. Electronically Signed   By: Karen Kays M.D.   On: 09/18/2022 16:44   DG Chest 2 View  Result Date: 09/18/2022 CLINICAL DATA:  Chest pain EXAM: CHEST - 2 VIEW COMPARISON:  April 04, 2018. FINDINGS: The heart size and mediastinal contours are within normal limits. Both lungs are clear. The visualized skeletal structures are unremarkable. IMPRESSION: No active cardiopulmonary disease. Electronically Signed   By: Lupita Raider M.D.   On: 09/18/2022 14:36    Procedures Procedures    Medications Ordered in ED Medications  ketorolac (TORADOL) 15 MG/ML injection 15 mg (15 mg Intravenous Given 09/18/22 1541)  iohexol (OMNIPAQUE) 350 MG/ML injection 100 mL (100 mLs Intravenous Contrast Given 09/18/22 1625)    ED Course/ Medical Decision Making/ A&P                             Medical Decision Making Amount and/or Complexity of Data Reviewed Labs: ordered. Radiology: ordered.  Risk Prescription drug management.   This patient presents to  the ED for concern of chest pain, this involves an extensive number of treatment options, and is a complaint that carries with it a high risk of complications and morbidity.  The differential diagnosis includes ACS, PE, musculoskeletal, pneumonia, pneumothorax, aortic dissection, aortic aneurysm   Co morbidities that complicate the patient evaluation  See HPI   Additional history obtained:  Additional history obtained from EMR External records from outside source obtained and reviewed including hospital records   Lab Tests:  I Ordered, and personally interpreted labs.  The pertinent results include: No leukocytosis.  Patient with evidence anemia with a globin of 10.90 which is actually improved from patient's baseline.  Placed within range.  No Electra abnormalities.  No renal dysfunction.  D-dimer elevated 0.7.  Troponin less than 2.  Urine pregnancy pending   Imaging Studies ordered:  I ordered imaging studies including chest x-ray, CT angio PE I independently visualized  and interpreted imaging which showed  Chest x-ray: No acute cardiopulmonary abnormalities CT angio chest PE: No evidence of PE or other cardiopulmonary abnormality I agree with the radiologist interpretation   Cardiac Monitoring: / EKG:  The patient was maintained on a cardiac monitor.  I personally viewed and interpreted the cardiac monitored which showed an underlying rhythm of: Sinus rhythm with atrial enlargement   Consultations Obtained:  N/a   Problem List / ED Course / Critical interventions / Medication management  Chest wall pain I ordered medication including Toradol   Reevaluation of the patient after these medicines showed that the patient improved I have reviewed the patients home medicines and have made adjustments as needed   Social Determinants of Health:  Tobacco, licit drug use   Test / Admission - Considered:  Chest wall pain Vitals signs  within normal range and stable throughout visit. Laboratory/imaging studies significant for: See above 37 year old female presents emergency department with complaints of chest pain beginning earlier this morning.  Workup today overall reassuring.  Patient with negative initial troponin, given duration of time since symptom onset, second troponin deemed unnecessary given 6+ hours since symptom onset.  EKG without acute ischemic changes.  Low suspicion for ACS.  Patient's chest pain initially described as pleuritic so D-dimer was ordered which was elevated 0.7; CT angio PE study was subsequently performed which was negative for PE.  No evidence of pneumonia.  Doubt aortic dissection, pericarditis/myocarditis/tamponade.  Patient's chest pain reproducible on exam with palpation of chest wall making pain more likely musculoskeletal in nature.  Patient did note significant improvement with administration of Toradol while in the emergency department.  Will recommend continued use of nonsteroidal anti-inflammatory medication in the  outpatient setting as well as follow-up with primary care for reassessment of symptoms.  Treatment plan discussed at length with patient and she acknowledged understanding was agreeable to said plan.  Patient overall well-appearing, afebrile in no acute distress. Worrisome signs and symptoms were discussed with the patient, and the patient acknowledged understanding to return to the ED if noticed. Patient was stable upon discharge.          Final Clinical Impression(s) / ED Diagnoses Final diagnoses:  Chest wall pain    Rx / DC Orders ED Discharge Orders          Ordered    ibuprofen (ADVIL) 600 MG tablet  Every 6 hours PRN        09/18/22 1731              Peter Garter, [place] 09/18/22 1738  Edwin Dada P, DO 09/22/22 1611

## 2022-09-18 NOTE — ED Triage Notes (Signed)
Pt reports constant mid sternal cp since 1300 today, reports pain worsens with inspiration, denies dizziness, n/v, diaphoresis, reports some ShoB, in no distress during triage

## 2022-09-18 NOTE — ED Notes (Signed)
Patient transported to CT 

## 2022-09-18 NOTE — Discharge Instructions (Signed)
As discussed, workup today overall reassuring.  Heart enzymes most normal.  Chest x-ray and CT scan of your chest without evidence of blood clot, pneumonia or other abnormality.  Given chest pain being reproducible with pressing on your chest wall, I suspect your chest pain is more musculoskeletal.  Will treat with nonsteroidal anti-inflammatory medication in the outpatient setting and recommend follow-up with primary care for reassessment.  Attached is information to call to set an appointment with a primary care provider within the [facility] health system.  Please do not hesitate to return to emergency department for worrisome signs and symptoms we discussed become apparent.

## 2022-09-18 NOTE — ED Notes (Signed)
Discharge instructions reviewed with patient. Patient verbalizes understanding, no further questions at this time. Medications/prescriptions and follow up information provided. No acute distress noted at time of departure.  

## 2023-02-16 IMAGING — US US OB < 14 WEEKS - US OB TV
1 series · 15 of 28 positions shown · non-contrast
Comparison: None.

CLINICAL DATA: Cramping and vaginal bleeding. Last menstrual period
02/28/2021. Gestational age by last menstrual period 4 weeks and 1
day. Estimated due date 11/09/2021.

EXAM:
OBSTETRIC <14 WK US AND TRANSVAGINAL OB US
TECHNIQUE: Both transabdominal and transvaginal ultrasound examinations were
performed for complete evaluation of the gestation as well as the
maternal uterus, adnexal regions, and pelvic cul-de-sac.
Transvaginal technique was performed to assess early pregnancy.

[Series 1: us ob < 14 weeks - us ob tv · 15 of 38 slices shown]
[im 1/38]
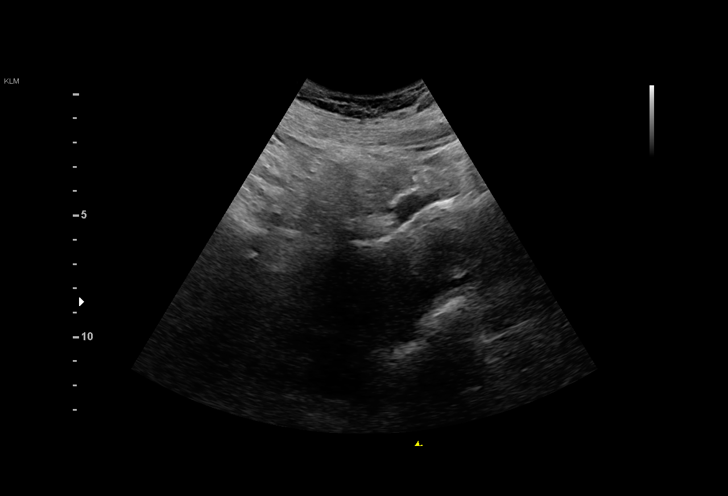
[im 3/38]
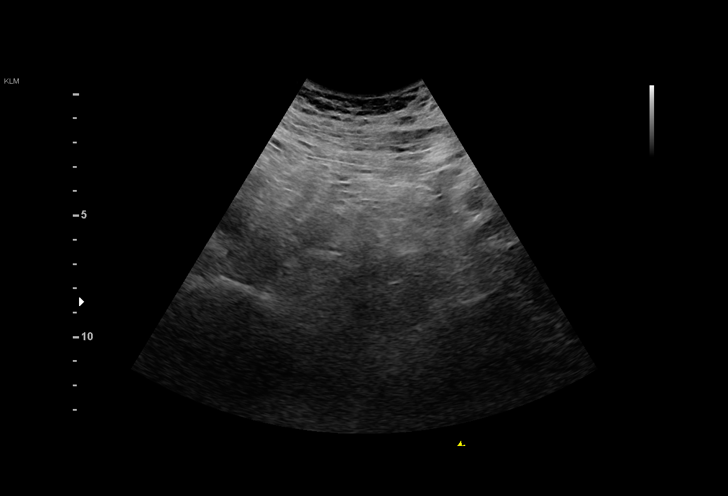
[im 6/38]
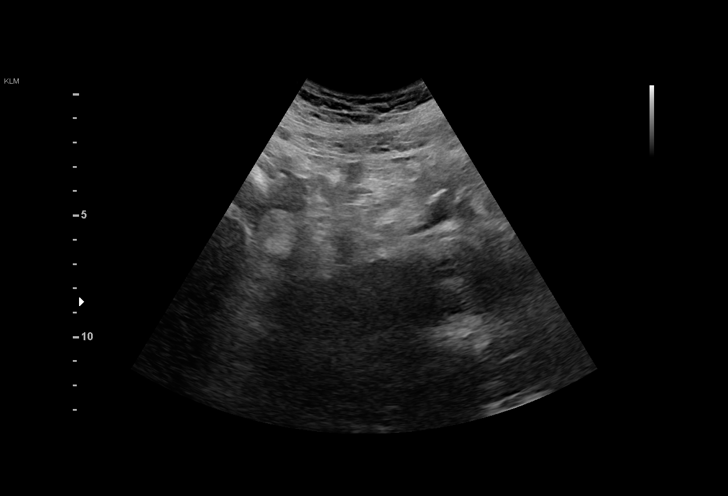
[im 9/38]
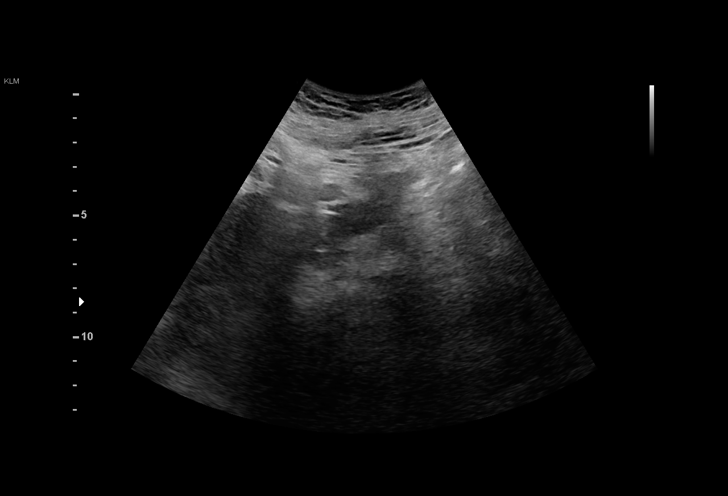
[im 11/38]
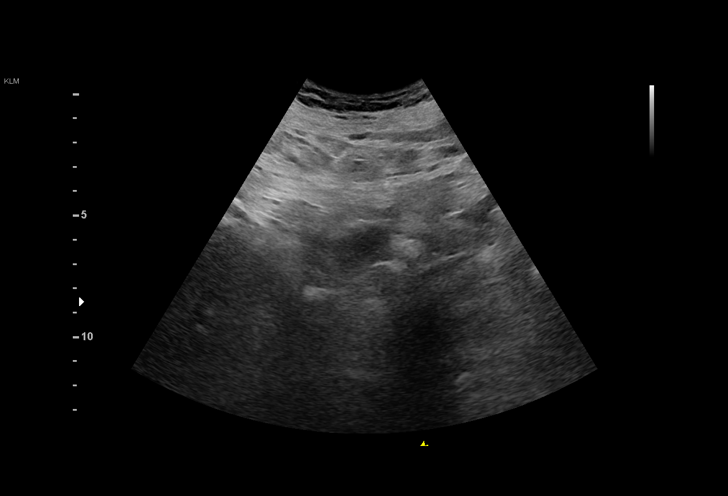
[im 14/38]
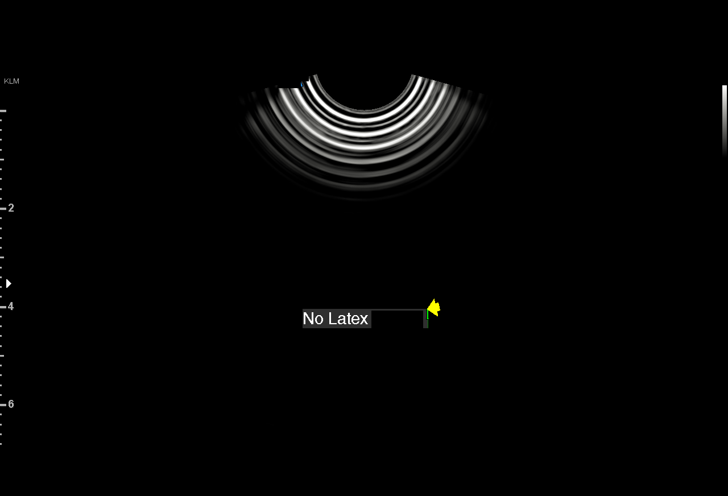
[im 17/38]
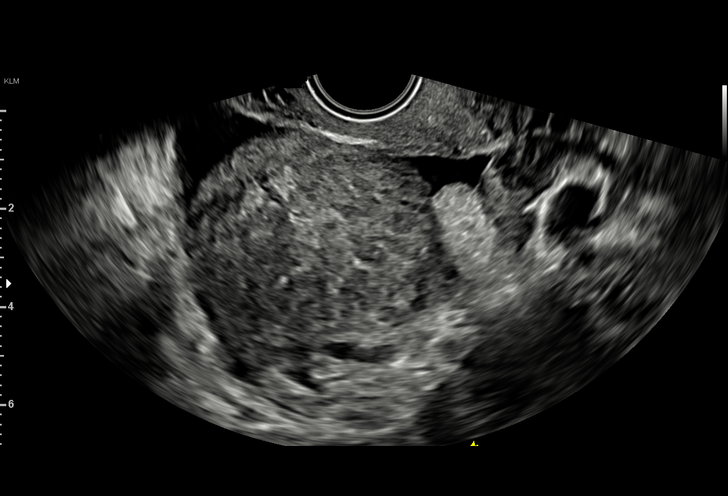
[im 20/38]
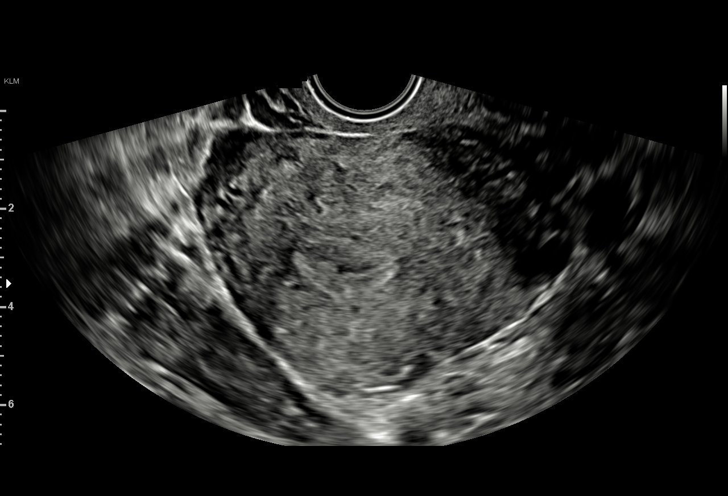
[im 21/38]
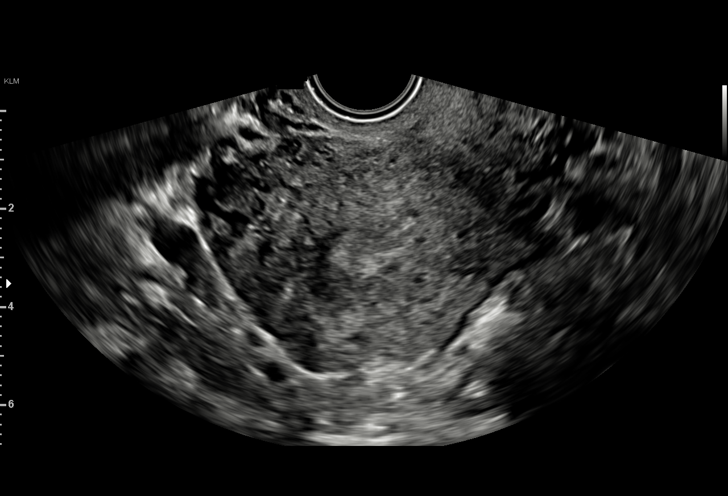
[im 24/38]
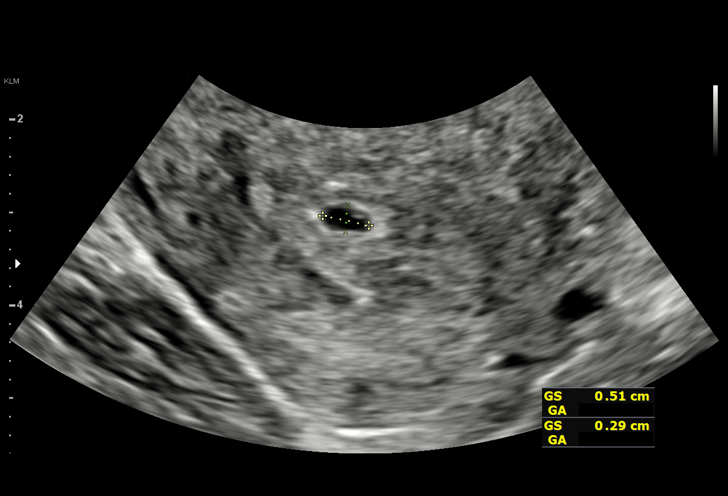
[im 27/38]
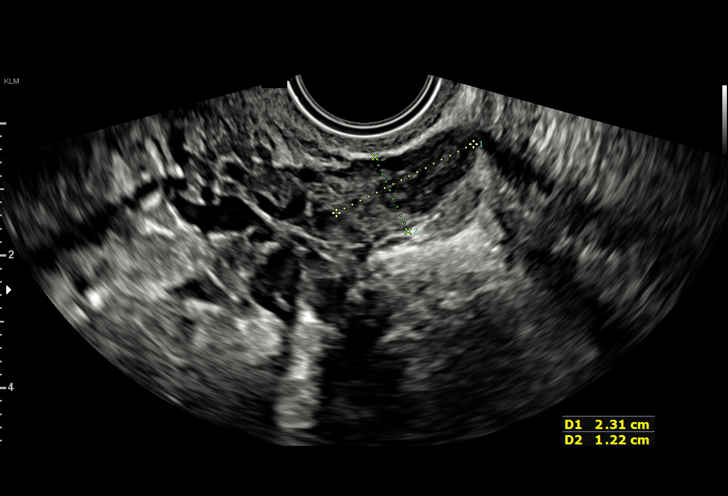
[im 29/38]
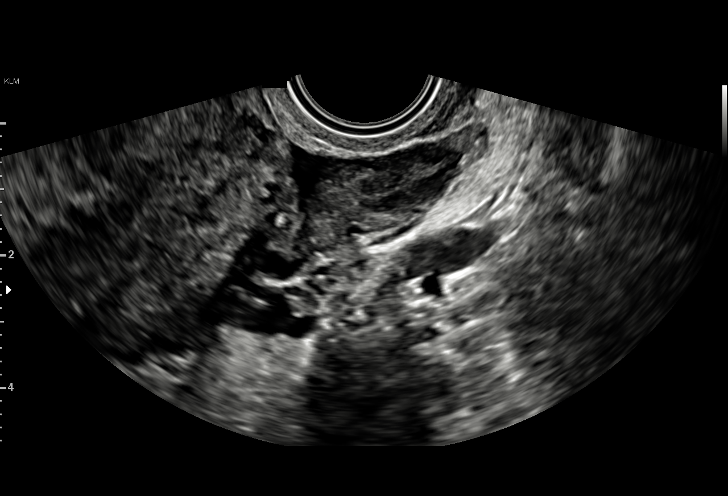
[im 32/38]
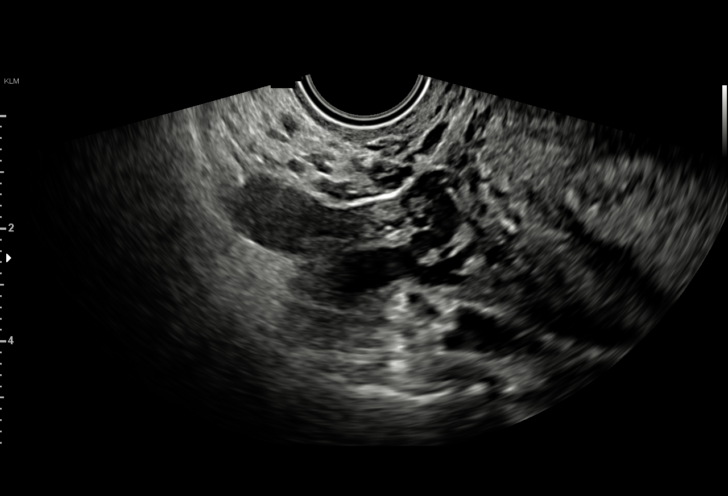
[im 35/38]
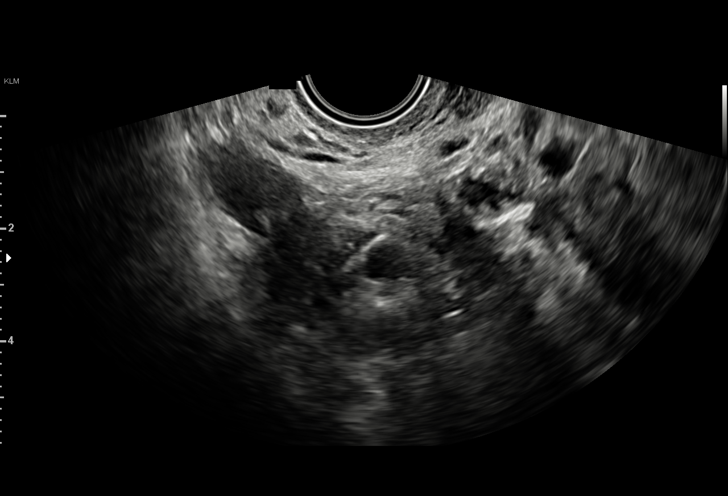
[im 38/38]
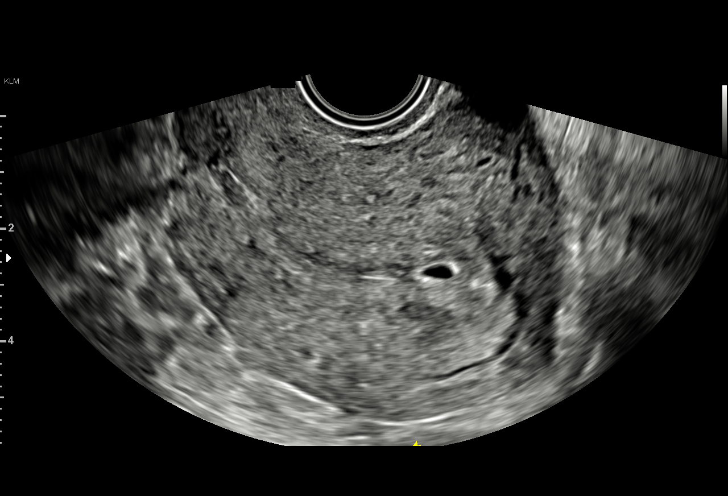

[15 of 28 positions shown; findings below may reference images not displayed]

FINDINGS: Intrauterine gestational sac: Single

Yolk sac:  Not Visualized.

Embryo:  Not Visualized.

Cardiac Activity: Not Visualized.

Heart Rate:   bpm

MSD: 4 mm   5 w   1 d

Subchorionic hemorrhage:  None visualized.

Maternal uterus/adnexae: Bilateral ovaries are unremarkable. Corpus
luteum cyst noted within the left ovary.

Other: Trace free fluid within the pelvis.
IMPRESSION: Probable early intrauterine gestational sac, but no yolk sac, fetal
pole, or cardiac activity yet visualized. Recommend follow-up
quantitative B-HCG levels and follow-up US in 14 days to assess
viability. This recommendation follows SRU consensus guidelines:
Diagnostic Criteria for Nonviable Pregnancy Early in the First
Trimester. N Engl J Med 4823; [DATE].

## 2024-07-12 ENCOUNTER — Encounter: Admitting: Family Medicine
